# Patient Record
Sex: Female | Born: 1955 | Race: White | Hispanic: No | Smoking: Never smoker
Health system: Southern US, Community
[De-identification: ages and names within clinical notes are randomized; demographics above are authoritative.]

## PROBLEM LIST (undated history)

## (undated) DIAGNOSIS — E785 Hyperlipidemia, unspecified: Secondary | ICD-10-CM

## (undated) DIAGNOSIS — F329 Major depressive disorder, single episode, unspecified: Secondary | ICD-10-CM

## (undated) DIAGNOSIS — G4700 Insomnia, unspecified: Secondary | ICD-10-CM

## (undated) DIAGNOSIS — R5382 Chronic fatigue, unspecified: Secondary | ICD-10-CM

## (undated) DIAGNOSIS — F331 Major depressive disorder, recurrent, moderate: Secondary | ICD-10-CM

## (undated) DIAGNOSIS — G4719 Other hypersomnia: Secondary | ICD-10-CM

## (undated) DIAGNOSIS — R7989 Other specified abnormal findings of blood chemistry: Secondary | ICD-10-CM

## (undated) DIAGNOSIS — N183 Chronic kidney disease, stage 3 unspecified: Secondary | ICD-10-CM

## (undated) DIAGNOSIS — F32A Depression, unspecified: Secondary | ICD-10-CM

## (undated) DIAGNOSIS — I10 Essential (primary) hypertension: Secondary | ICD-10-CM

## (undated) HISTORY — DX: Other hypersomnia: G47.19

## (undated) HISTORY — PX: MELANOMA EXCISION: SHX5266

## (undated) HISTORY — DX: Other specified abnormal findings of blood chemistry: R79.89

## (undated) HISTORY — DX: Hyperlipidemia, unspecified: E78.5

## (undated) HISTORY — DX: Insomnia, unspecified: G47.00

## (undated) HISTORY — DX: Major depressive disorder, recurrent, moderate: F33.1

## (undated) HISTORY — PX: HYSTEROTOMY: SHX1776

## (undated) HISTORY — DX: Chronic kidney disease, stage 3 unspecified: N18.30

## (undated) HISTORY — DX: Chronic fatigue, unspecified: R53.82

## (undated) HISTORY — DX: Depression, unspecified: F32.A

## (undated) HISTORY — PX: OTHER SURGICAL HISTORY: SHX169

## (undated) HISTORY — DX: Essential (primary) hypertension: I10

---
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
## 1999-02-15 ENCOUNTER — Encounter: Payer: Self-pay | Admitting: Gynecology

## 1999-02-17 ENCOUNTER — Ambulatory Visit (HOSPITAL_COMMUNITY): Admission: RE | Admit: 1999-02-17 | Discharge: 1999-02-17 | Payer: Self-pay | Admitting: Gynecology

## 1999-04-27 ENCOUNTER — Encounter: Admission: RE | Admit: 1999-04-27 | Discharge: 1999-04-27 | Payer: Self-pay

## 1999-05-09 ENCOUNTER — Ambulatory Visit: Admission: RE | Admit: 1999-05-09 | Discharge: 1999-05-09 | Payer: Self-pay | Admitting: Gynecology

## 1999-06-05 ENCOUNTER — Ambulatory Visit (HOSPITAL_COMMUNITY): Admission: RE | Admit: 1999-06-05 | Discharge: 1999-06-05 | Payer: Self-pay | Admitting: Gastroenterology

## 2000-05-03 ENCOUNTER — Encounter: Admission: RE | Admit: 2000-05-03 | Discharge: 2000-05-03 | Payer: Self-pay | Admitting: Gynecology

## 2000-05-03 ENCOUNTER — Encounter: Payer: Self-pay | Admitting: Gynecology

## 2000-05-23 ENCOUNTER — Other Ambulatory Visit: Admission: RE | Admit: 2000-05-23 | Discharge: 2000-05-23 | Payer: Self-pay | Admitting: Gynecology

## 2001-04-23 HISTORY — PX: SKIN BIOPSY: SHX1

## 2001-05-05 ENCOUNTER — Encounter: Payer: Self-pay | Admitting: Gynecology

## 2001-05-05 ENCOUNTER — Encounter: Admission: RE | Admit: 2001-05-05 | Discharge: 2001-05-05 | Payer: Self-pay | Admitting: Gynecology

## 2001-05-26 ENCOUNTER — Other Ambulatory Visit: Admission: RE | Admit: 2001-05-26 | Discharge: 2001-05-26 | Payer: Self-pay | Admitting: Gynecology

## 2002-08-17 ENCOUNTER — Encounter: Admission: RE | Admit: 2002-08-17 | Discharge: 2002-08-17 | Payer: Self-pay | Admitting: Gynecology

## 2002-08-17 ENCOUNTER — Encounter: Payer: Self-pay | Admitting: Gynecology

## 2002-08-25 ENCOUNTER — Other Ambulatory Visit: Admission: RE | Admit: 2002-08-25 | Discharge: 2002-08-25 | Payer: Self-pay | Admitting: Gynecology

## 2003-10-14 ENCOUNTER — Other Ambulatory Visit: Admission: RE | Admit: 2003-10-14 | Discharge: 2003-10-14 | Payer: Self-pay | Admitting: Gynecology

## 2003-11-01 ENCOUNTER — Encounter: Admission: RE | Admit: 2003-11-01 | Discharge: 2003-11-01 | Payer: Self-pay | Admitting: Gynecology

## 2004-11-29 ENCOUNTER — Other Ambulatory Visit: Admission: RE | Admit: 2004-11-29 | Discharge: 2004-11-29 | Payer: Self-pay | Admitting: Gynecology

## 2004-12-05 ENCOUNTER — Encounter: Admission: RE | Admit: 2004-12-05 | Discharge: 2004-12-05 | Payer: Self-pay | Admitting: Gynecology

## 2005-12-06 ENCOUNTER — Encounter: Admission: RE | Admit: 2005-12-06 | Discharge: 2005-12-06 | Payer: Self-pay | Admitting: Gynecology

## 2006-01-01 ENCOUNTER — Other Ambulatory Visit: Admission: RE | Admit: 2006-01-01 | Discharge: 2006-01-01 | Payer: Self-pay | Admitting: Gynecology

## 2007-01-07 ENCOUNTER — Encounter: Admission: RE | Admit: 2007-01-07 | Discharge: 2007-01-07 | Payer: Self-pay | Admitting: Gynecology

## 2007-01-27 ENCOUNTER — Encounter: Admission: RE | Admit: 2007-01-27 | Discharge: 2007-01-27 | Payer: Self-pay | Admitting: Gynecology

## 2008-01-09 ENCOUNTER — Encounter: Admission: RE | Admit: 2008-01-09 | Discharge: 2008-01-09 | Payer: Self-pay | Admitting: Gynecology

## 2009-01-24 ENCOUNTER — Encounter: Admission: RE | Admit: 2009-01-24 | Discharge: 2009-01-24 | Payer: Self-pay | Admitting: Gynecology

## 2010-02-07 ENCOUNTER — Encounter: Admission: RE | Admit: 2010-02-07 | Discharge: 2010-02-07 | Payer: Self-pay | Admitting: Gynecology

## 2010-05-14 ENCOUNTER — Encounter: Payer: Self-pay | Admitting: Gynecology

## 2011-01-23 ENCOUNTER — Other Ambulatory Visit: Payer: Self-pay | Admitting: Gynecology

## 2011-01-23 DIAGNOSIS — Z1231 Encounter for screening mammogram for malignant neoplasm of breast: Secondary | ICD-10-CM

## 2011-02-15 ENCOUNTER — Ambulatory Visit
Admission: RE | Admit: 2011-02-15 | Discharge: 2011-02-15 | Disposition: A | Discharge status: Home / Self Care | Payer: 59 | Source: Ambulatory Visit | Attending: Gynecology | Admitting: Gynecology

## 2011-02-15 DIAGNOSIS — Z1231 Encounter for screening mammogram for malignant neoplasm of breast: Secondary | ICD-10-CM

## 2012-03-12 ENCOUNTER — Other Ambulatory Visit: Payer: Self-pay | Admitting: Gynecology

## 2012-03-12 DIAGNOSIS — Z1231 Encounter for screening mammogram for malignant neoplasm of breast: Secondary | ICD-10-CM

## 2012-03-31 ENCOUNTER — Other Ambulatory Visit: Payer: Self-pay | Admitting: Gynecology

## 2012-03-31 DIAGNOSIS — M8580 Other specified disorders of bone density and structure, unspecified site: Secondary | ICD-10-CM

## 2012-04-22 ENCOUNTER — Ambulatory Visit
Admission: RE | Admit: 2012-04-22 | Discharge: 2012-04-22 | Disposition: A | Discharge status: Home / Self Care | Payer: BC Managed Care – PPO | Source: Ambulatory Visit | Attending: Gynecology | Admitting: Gynecology

## 2012-04-22 DIAGNOSIS — Z1231 Encounter for screening mammogram for malignant neoplasm of breast: Secondary | ICD-10-CM

## 2012-04-22 DIAGNOSIS — M8580 Other specified disorders of bone density and structure, unspecified site: Secondary | ICD-10-CM

## 2018-12-10 DIAGNOSIS — L821 Other seborrheic keratosis: Secondary | ICD-10-CM | POA: Diagnosis not present

## 2018-12-10 DIAGNOSIS — D2262 Melanocytic nevi of left upper limb, including shoulder: Secondary | ICD-10-CM | POA: Diagnosis not present

## 2018-12-10 DIAGNOSIS — D485 Neoplasm of uncertain behavior of skin: Secondary | ICD-10-CM | POA: Diagnosis not present

## 2018-12-10 DIAGNOSIS — D1721 Benign lipomatous neoplasm of skin and subcutaneous tissue of right arm: Secondary | ICD-10-CM | POA: Diagnosis not present

## 2018-12-10 DIAGNOSIS — B078 Other viral warts: Secondary | ICD-10-CM | POA: Diagnosis not present

## 2018-12-10 DIAGNOSIS — D692 Other nonthrombocytopenic purpura: Secondary | ICD-10-CM | POA: Diagnosis not present

## 2018-12-10 DIAGNOSIS — D1801 Hemangioma of skin and subcutaneous tissue: Secondary | ICD-10-CM | POA: Diagnosis not present

## 2018-12-10 DIAGNOSIS — Z85828 Personal history of other malignant neoplasm of skin: Secondary | ICD-10-CM | POA: Diagnosis not present

## 2018-12-10 DIAGNOSIS — L820 Inflamed seborrheic keratosis: Secondary | ICD-10-CM | POA: Diagnosis not present

## 2018-12-10 DIAGNOSIS — Z85820 Personal history of malignant melanoma of skin: Secondary | ICD-10-CM | POA: Diagnosis not present

## 2018-12-10 DIAGNOSIS — L814 Other melanin hyperpigmentation: Secondary | ICD-10-CM | POA: Diagnosis not present

## 2018-12-10 DIAGNOSIS — L570 Actinic keratosis: Secondary | ICD-10-CM | POA: Diagnosis not present

## 2019-06-13 ENCOUNTER — Ambulatory Visit: Payer: PPO | Attending: Internal Medicine

## 2019-06-13 DIAGNOSIS — Z23 Encounter for immunization: Secondary | ICD-10-CM

## 2019-06-13 NOTE — Progress Notes (Signed)
   Covid-19 Vaccination Clinic  Name:  Anna Norman    MRN: [id number] DOB: 1955-11-08  06/13/2019  Anna Norman was observed post Covid-19 immunization for 15 minutes without incidence. She was provided with Vaccine Information Sheet and instruction to access the V-Safe system.   Anna Norman was instructed to call 911 with any severe reactions post vaccine: Marland Kitchen Difficulty breathing  . Swelling of your face and throat  . A fast heartbeat  . A bad rash all over your body  . Dizziness and weakness    Immunizations Administered    Name Date Dose VIS Date Route   Pfizer COVID-19 Vaccine 06/13/2019 12:34 PM 0.3 mL 04/03/2019 Intramuscular   Manufacturer: [place]   Lot: [id number]   [place]: [id number]

## 2019-07-06 ENCOUNTER — Ambulatory Visit: Payer: PPO | Attending: Internal Medicine

## 2019-07-06 DIAGNOSIS — Z23 Encounter for immunization: Secondary | ICD-10-CM

## 2019-07-06 NOTE — Progress Notes (Signed)
   Covid-19 Vaccination Clinic  Name:  Anna Norman    MRN: [id number] DOB: 1955-11-18  07/06/2019  [name] was observed post Covid-19 immunization for 15 minutes without incident. She was provided with Vaccine Information Sheet and instruction to access the V-Safe system.   [name] was instructed to call 911 with any severe reactions post vaccine: Marland Kitchen Difficulty breathing  . Swelling of face and throat  . A fast heartbeat  . A bad rash all over body  . Dizziness and weakness   Immunizations Administered    Name Date Dose VIS Date Route   Pfizer COVID-19 Vaccine 07/06/2019 11:42 AM 0.3 mL 04/03/2019 Intramuscular   Manufacturer: [place]   Lot: [id number]   [place]: [id number]

## 2019-08-17 DIAGNOSIS — I10 Essential (primary) hypertension: Secondary | ICD-10-CM | POA: Diagnosis not present

## 2019-08-17 DIAGNOSIS — Z0000 Encounter for general adult medical examination without abnormal findings: Secondary | ICD-10-CM | POA: Diagnosis not present

## 2019-08-17 DIAGNOSIS — E785 Hyperlipidemia, unspecified: Secondary | ICD-10-CM | POA: Diagnosis not present

## 2019-09-18 ENCOUNTER — Encounter: Payer: Self-pay | Admitting: Interventional Cardiology

## 2019-09-18 ENCOUNTER — Ambulatory Visit: Payer: PPO | Admitting: Interventional Cardiology

## 2019-09-18 ENCOUNTER — Other Ambulatory Visit: Payer: Self-pay

## 2019-09-18 VITALS — BP 154/94 | HR 89 | Ht 62.0 in | Wt 112.4 lb

## 2019-09-18 DIAGNOSIS — I10 Essential (primary) hypertension: Secondary | ICD-10-CM

## 2019-09-18 DIAGNOSIS — R9431 Abnormal electrocardiogram [ECG] [EKG]: Secondary | ICD-10-CM | POA: Diagnosis not present

## 2019-09-18 MED ORDER — LISINOPRIL 10 MG PO TABS: 10.0000 mg | ORAL_TABLET | Freq: Every day | ORAL | 3 refills | Status: DC

## 2019-09-18 NOTE — Patient Instructions (Addendum)
Medication Instructions:  Your physician has recommended you make the following change in your medication:  1-START Lisinopril 10 mg by mouth daily.  *If you need a refill on your cardiac medications before your next appointment, please call your pharmacy*   Lab Work: Your physician recommends that you return for lab work in: 1 week for [employer].  If you have labs (blood work) drawn today and your tests are completely normal, you will receive your results only by: Marland Kitchen MyChart Message (if you have MyChart) OR . A paper copy in the mail If you have any lab test that is abnormal or we need to change your treatment, we will call you to review the results.  Testing/Procedures: Your physician has requested that you have an echocardiogram. Echocardiography is a painless test that uses sound waves to create images of your heart. It provides your doctor with information about the size and shape of your heart and how well your heart's chambers and valves are working. This procedure takes approximately one hour. There are no restrictions for this procedure.  Follow-Up: Your physician recommends that you schedule a follow-up appointment in: 2 weeks with [place], you and your health needs are our priority.  As part of our continuing mission to provide you with exceptional heart care, we have created designated Provider Care Teams.  These Care Teams include your primary Cardiologist (physician) and Advanced Practice Providers (APPs -  Physician Assistants and Nurse Practitioners) who all work together to provide you with the care you need, when you need it.  We recommend signing up for the patient portal called "MyChart".  Sign up information is provided on this After Visit Summary.  MyChart is used to connect with patients for Virtual Visits (Telemedicine).  Patients are able to view lab/test results, encounter notes, upcoming appointments, etc.  Non-urgent messages can be sent  to your provider as well.   To learn more about what you can do with MyChart, go to [email or web address].    Your next appointment:   As needed  The format for your next appointment:   In Person  Provider:   You may see Larae Grooms, MD or one of the following Advanced Practice Providers on your designated Care Team:    Melina Copa, PA-C  Ermalinda Barrios, PA-C    Other Instructions   High-Fiber Diet Fiber, also called dietary fiber, is a type of carbohydrate that is found in fruits, vegetables, whole grains, and beans. A high-fiber diet can have many health benefits. Your health care provider may recommend a high-fiber diet to help:  Prevent constipation. Fiber can make your bowel movements more regular.  Lower your cholesterol.  Relieve the following conditions: ? Swelling of veins in the anus (hemorrhoids). ? Swelling and irritation (inflammation) of specific areas of the digestive tract (uncomplicated diverticulosis). ? A problem of the large intestine (colon) that sometimes causes pain and diarrhea (irritable bowel syndrome, IBS).  Prevent overeating as part of a weight-loss plan.  Prevent heart disease, type 2 diabetes, and certain cancers. What is my plan? The recommended daily fiber intake in grams (g) includes:  38 g for men age 52 or younger.  30 g for men over age 78.  2 g for women age 30 or younger.  21 g for women over age 78. You can get the recommended daily intake of dietary fiber by:  Eating a variety of fruits, vegetables, grains, and beans.  Taking a fiber supplement, if  it is not possible to get enough fiber through your diet. What do I need to know about a high-fiber diet?  It is better to get fiber through food sources rather than from fiber supplements. There is not a lot of research about how effective supplements are.  Always check the fiber content on the nutrition facts label of any prepackaged food. Look for foods that contain 5  g of fiber or more per serving.  Talk with a diet and nutrition specialist (dietitian) if you have questions about specific foods that are recommended or not recommended for your medical condition, especially if those foods are not listed below.  Gradually increase how much fiber you consume. If you increase your intake of dietary fiber too quickly, you may have bloating, cramping, or gas.  Drink plenty of water. Water helps you to digest fiber. What are tips for following this plan?  Eat a wide variety of high-fiber foods.  Make sure that half of the grains that you eat each day are whole grains.  Eat breads and cereals that are made with whole-grain flour instead of refined flour or white flour.  Eat brown rice, bulgur wheat, or millet instead of white rice.  Start the day with a breakfast that is high in fiber, such as a cereal that contains 5 g of fiber or more per serving.  Use beans in place of meat in soups, salads, and pasta dishes.  Eat high-fiber snacks, such as berries, raw vegetables, nuts, and popcorn.  Choose whole fruits and vegetables instead of processed forms like juice or sauce. What foods can I eat?  Fruits Berries. Pears. Apples. Oranges. Avocado. Prunes and raisins. Dried figs. Vegetables Sweet potatoes. Spinach. Kale. Artichokes. Cabbage. Broccoli. Cauliflower. Green peas. Carrots. Squash. Grains Whole-grain breads. Multigrain cereal. Oats and oatmeal. Brown rice. Barley. Bulgur wheat. [place]. Quinoa. Bran muffins. Popcorn. Rye wafer crackers. Meats and other proteins Navy, kidney, and pinto beans. Soybeans. Split peas. Lentils. Nuts and seeds. Dairy Fiber-fortified yogurt. Beverages Fiber-fortified soy milk. Fiber-fortified orange juice. Other foods Fiber bars. The items listed above may not be a complete list of recommended foods and beverages. Contact a dietitian for more options. What foods are not recommended? Fruits Fruit juice. Cooked, strained  fruit. Vegetables Fried potatoes. Canned vegetables. Well-cooked vegetables. Grains White bread. Pasta made with refined flour. White rice. Meats and other proteins Fatty cuts of meat. Fried chicken or fried fish. Dairy Milk. Yogurt. Cream cheese. Sour cream. Fats and oils Butters. Beverages Soft drinks. Other foods Cakes and pastries. The items listed above may not be a complete list of foods and beverages to avoid. Contact a dietitian for more information. Summary  Fiber is a type of carbohydrate. It is found in fruits, vegetables, whole grains, and beans.  There are many health benefits of eating a high-fiber diet, such as preventing constipation, lowering blood cholesterol, helping with weight loss, and reducing your risk of heart disease, diabetes, and certain cancers.  Gradually increase your intake of fiber. Increasing too fast can result in cramping, bloating, and gas. Drink plenty of water while you increase your fiber.  The best sources of fiber include whole fruits and vegetables, whole grains, nuts, seeds, and beans. This information is not intended to replace advice given to you by your health care provider. Make sure you discuss any questions you have with your health care provider. Document Revised: 02/11/2017 Document Reviewed: 02/11/2017 Elsevier Patient Education  2020 [employer].

## 2019-09-18 NOTE — Progress Notes (Signed)
Cardiology Office Note   Date:  09/18/2019   ID:  Anna, Norman 1955/08/02, MRN [id number]  PCP:  Anna Small, MD    No chief complaint on file.  Abnormal ECG  Wt Readings from Last 3 Encounters:  09/18/19 112 lb 6.4 oz (51 kg)       History of Present Illness: Anna Norman is a 64 y.o. female who is being seen today for the evaluation of abnormal ECG at the request of Anna Small, MD.  No prior cardiac history.  She thinks she had an abnormal CRP in the past, 15-20 years ago.  No further tests were done.  No prior ECG or cardiac imaging done in the past.    No family h/o CAD. 3 brothers are healthy form a cardiac standpoint.  One brother with multiple myeloma.  Other brothers with high cholesterol.  No stents or CABG.   She walks for an hour 2 times/week.  No sx with this activity.  She had an ECG at PMD when her BP was elevated (170s).  She has been on a diuretic for BP for over 20 years.   She had been having high BP readings that were high when she donated plasma in late 2020.   She limits salt in her diet.    Denies : exertional Chest pain. Dizziness. Leg edema. Nitroglycerin use. Orthopnea. Palpitations. Paroxysmal nocturnal dyspnea. Shortness of breath. Syncope.     Past Medical History:  Diagnosis Date  . Chronic fatigue   . Chronic kidney disease (CKD), active medical management without dialysis, stage 3 (moderate)   . Depression   . Elevated LFTs   . Excessive daytime sleepiness   . Hyperlipidemia   . Hypertension   . Insomnia   . Moderate recurrent major depression ([place])        Current Outpatient Medications  Medication Sig Dispense Refill  . buPROPion (WELLBUTRIN XL) 300 MG 24 hr tablet Take 300 mg by mouth daily.    . Estradiol (VAGIFEM) 10 MCG TABS vaginal tablet Place vaginally.    . Ferrous Sulfate (IRON PO) Take by mouth.    . Multiple Vitamin (MULTIVITAMIN) tablet Take 1 tablet by mouth daily.    . traZODone  (DESYREL) 50 MG tablet Take 50 mg by mouth at bedtime.    . triamterene-hydrochlorothiazide (MAXZIDE) 75-50 MG tablet Take 1 tablet by mouth daily.    Marland Kitchen lisinopril (ZESTRIL) 10 MG tablet Take 1 tablet (10 mg total) by mouth daily. 90 tablet 3   No current facility-administered medications for this visit.    Allergies:   Patient has no allergy information on record.    Social History:  The patient  reports that she has never smoked. She has never used smokeless tobacco. She reports current alcohol use. She reports that she does not use drugs.   Family History:  The patient's family history includes Cancer in her brother and brother; Cancer - Prostate in her father; Colon polyps in her brother; Glaucoma in her mother; Healthy in her brother; Hypertension in her mother; Other in her brother.    ROS:  Please see the history of present illness.   Otherwise, review of systems are positive for twinges in her chest-lasting a few seconds.   All other systems are reviewed and negative.    PHYSICAL EXAM: VS:  BP (!) 154/94   Pulse 89   Ht 5' 2" (1.575 m)   Wt 112 lb 6.4 oz (51 kg)  SpO2 98%   BMI 20.56 kg/m  , BMI Body mass index is 20.56 kg/m. GEN: Well nourished, well developed, in no acute distress  HEENT: normal  Neck: no JVD, carotid bruits, or masses Cardiac: RRR; no murmurs, rubs, or gallops,no edema  Respiratory:  clear to auscultation bilaterally, normal work of breathing GI: soft, nontender, nondistended, + BS MS: no deformity or atrophy  Skin: warm and dry, no rash Neuro:  Strength and sensation are intact Psych: euthymic mood, full affect   EKG:   The ekg ordered today demonstrates NSR, inferior Q waves; mild ST depressions   Recent Labs: No results found for requested labs within last 8760 hours.   Lipid Panel No results found for: CHOL, TRIG, HDL, CHOLHDL, VLDL, LDLCALC, LDLDIRECT   Other studies Reviewed: Additional studies/ records that were reviewed today with  results demonstrating: ECG and labs from PMD reviewed .   ASSESSMENT AND PLAN:  1. HTN: Start lisinopril 10 mg daily. BMet in 1 week.  F/u with HTN clinic.  May need to stop triamterene if potassium increases.  Low salt diet.  2. Plan for echo given abnormal ECG. If abnormal EF, may need cath.  If normal EF, likely no further w/u needed.  No old ECG available.  3. Low salt diet.  Whole food, plant based diet recommended.     Current medicines are reviewed at length with the patient today.  The patient concerns regarding her medicines were addressed.  The following changes have been made:  No change  Labs/ tests ordered today include:   Orders Placed This Encounter  Procedures  . Basic metabolic panel  . ECHOCARDIOGRAM COMPLETE    Recommend 150 minutes/week of aerobic exercise Low fat, low carb, high fiber diet recommended  Disposition:   FU with PharmD HTN clinic   Signed, Larae Grooms, MD  09/18/2019 1:14 PM    [facility], [place], [place]  [address] Phone: [phone]; Fax: [phone]

## 2019-09-25 ENCOUNTER — Other Ambulatory Visit: Payer: Self-pay

## 2019-09-25 ENCOUNTER — Other Ambulatory Visit: Payer: PPO | Admitting: *Deleted

## 2019-09-25 DIAGNOSIS — I10 Essential (primary) hypertension: Secondary | ICD-10-CM | POA: Diagnosis not present

## 2019-09-25 DIAGNOSIS — R9431 Abnormal electrocardiogram [ECG] [EKG]: Secondary | ICD-10-CM | POA: Diagnosis not present

## 2019-09-25 LAB — BASIC METABOLIC PANEL
BUN/Creatinine Ratio: 17 (ref 12–28)
BUN: 22 mg/dL (ref 8–27)
CO2: 24 mmol/L (ref 20–29)
Calcium: 9.6 mg/dL (ref 8.7–10.3)
Chloride: 97 mmol/L (ref 96–106)
Creatinine, Ser: 1.29 mg/dL — ABNORMAL HIGH (ref 0.57–1.00)
GFR calc Af Amer: 50 mL/min/{1.73_m2} — ABNORMAL LOW (ref >59)
GFR calc non Af Amer: 44 mL/min/{1.73_m2} — ABNORMAL LOW (ref >59)
Glucose: 97 mg/dL (ref 65–99)
Potassium: 4.4 mmol/L (ref 3.5–5.2)
Sodium: 136 mmol/L (ref 134–144)

## 2019-09-30 ENCOUNTER — Telehealth: Payer: Self-pay | Admitting: Interventional Cardiology

## 2019-09-30 DIAGNOSIS — R7989 Other specified abnormal findings of blood chemistry: Secondary | ICD-10-CM

## 2019-09-30 DIAGNOSIS — R899 Unspecified abnormal finding in specimens from other organs, systems and tissues: Secondary | ICD-10-CM

## 2019-09-30 DIAGNOSIS — Z79899 Other long term (current) drug therapy: Secondary | ICD-10-CM

## 2019-09-30 NOTE — Telephone Encounter (Signed)
Patient returning call for lab results. 

## 2019-09-30 NOTE — Telephone Encounter (Signed)
Left message to call back  

## 2019-10-01 NOTE — Telephone Encounter (Signed)
Patient returning call.

## 2019-10-01 NOTE — Telephone Encounter (Signed)
Jettie Booze, MD  09/29/2019 11:24 PM EDT     Would have her drink 64 oz water daily and recheck BMet at visit with PharmD.    Spoke with the pt and informed her that per Dr. Irish Lack, based on her lab results, she needs to increase her water intake to drinking 64 oz of H2O a day, and then we will recheck her BMET again, same day as she has her echo and PharmD appt, on 10/07/19. Pt education provided that 64 oz of water is equivalent to drinking #4 16 oz bottles of water daily.  Scheduled the pt to come in for lab to recheck a BMET on 10/07/19.  Informed her that she can arrive at 2 pm, get her lab done, then proceed with her scheduled echo and pharmD appt thereafter. Pt verbalized understanding and agrees with this plan. Pt appreciative for all the assistance provided.

## 2019-10-07 ENCOUNTER — Ambulatory Visit (HOSPITAL_COMMUNITY): Payer: PPO | Attending: Cardiovascular Disease

## 2019-10-07 ENCOUNTER — Other Ambulatory Visit: Payer: Self-pay

## 2019-10-07 ENCOUNTER — Ambulatory Visit (INDEPENDENT_AMBULATORY_CARE_PROVIDER_SITE_OTHER): Payer: PPO | Admitting: Pharmacist

## 2019-10-07 ENCOUNTER — Other Ambulatory Visit: Payer: PPO | Admitting: *Deleted

## 2019-10-07 DIAGNOSIS — R9431 Abnormal electrocardiogram [ECG] [EKG]: Secondary | ICD-10-CM | POA: Diagnosis not present

## 2019-10-07 DIAGNOSIS — E785 Hyperlipidemia, unspecified: Secondary | ICD-10-CM | POA: Insufficient documentation

## 2019-10-07 DIAGNOSIS — I10 Essential (primary) hypertension: Secondary | ICD-10-CM

## 2019-10-07 DIAGNOSIS — R7989 Other specified abnormal findings of blood chemistry: Secondary | ICD-10-CM | POA: Diagnosis not present

## 2019-10-07 DIAGNOSIS — R899 Unspecified abnormal finding in specimens from other organs, systems and tissues: Secondary | ICD-10-CM | POA: Diagnosis not present

## 2019-10-07 DIAGNOSIS — Z79899 Other long term (current) drug therapy: Secondary | ICD-10-CM

## 2019-10-07 DIAGNOSIS — Z8659 Personal history of other mental and behavioral disorders: Secondary | ICD-10-CM | POA: Insufficient documentation

## 2019-10-07 LAB — ECHOCARDIOGRAM COMPLETE

## 2019-10-07 NOTE — Progress Notes (Signed)
Patient ID: CLARE FENNIMORE                 DOB: 1955/11/23                      MRN: [id number]     HPI: Linsi Humann is a 64 y.o. female referred by Dr. Irish Lack to HTN clinic. PMH is significant for HTN, HLD, and depression. She was last seen by Dr Irish Lack on 09/18/19 and BP was elevated at 154/94. Pt was started on lisinopril 10mg  daily. Follow up BMET showed increase in SCr of 27% - acceptable rise < 30% threshold, pt advised to drink more water. Meds continued and plan to recheck BMET today along with BP follow up and echo.  Pt presents today in good spirits. State she has been on triamterene-HCTZ for the past 20-25 years by her PCP. Has not tried any other BP lowering meds. Does not own a BP cuff. Donates plasma and states BP was elevated there the other week with systolic ~678. Felt a little dizzy for the first few days after starting lisinopril but this has since resolved. Uses NSAIDs occasionally, maybe once a month. Has been staying more hydrated over the past few weeks. Does have some family stress at home.  Current HTN meds: lisinopril 10mg  daily, triamterene-HCTZ 75-50mg  daily  BP goal: <130/43mmHg  Family History: The patient's family history includes Cancer in her brother and brother; Cancer - Prostate in her father; Colon polyps in her brother; Glaucoma in her mother; Healthy in her brother; Hypertension in her mother; Other in her brother.   Social History: The patient  reports that she has never smoked. She has never used smokeless tobacco. She reports current alcohol use. She reports that she does not use drugs.   Diet: Occasionally drinks tea, soda, or coffee. Typically has a total of 3 servings a day. Doesn't add salt to food.   Exercise: Walks for an hour 2x.week.  Home BP readings: does not have a BP cuff  Labs: 09/25/19: SCr 1.29, Na 136, K 4.4 (added lisinopril 10mg ) 09/10/19: SCr 0.94, Na 138, K 4.4 (on triamterene-HCTZ)  Wt Readings from Last 3 Encounters:    09/18/19 112 lb 6.4 oz (51 kg)   BP Readings from Last 3 Encounters:  09/18/19 (!) 154/94   Pulse Readings from Last 3 Encounters:  09/18/19 89    Renal function: CrCl cannot be calculated (Unknown ideal weight.).  Past Medical History:  Diagnosis Date  . Chronic fatigue   . Chronic kidney disease (CKD), active medical management without dialysis, stage 3 (moderate)   . Depression   . Elevated LFTs   . Excessive daytime sleepiness   . Hyperlipidemia   . Hypertension   . Insomnia   . Moderate recurrent major depression ([place])     Current Outpatient Medications on File Prior to Visit  Medication Sig Dispense Refill  . buPROPion (WELLBUTRIN XL) 300 MG 24 hr tablet Take 300 mg by mouth daily.    . Estradiol (VAGIFEM) 10 MCG TABS vaginal tablet Place vaginally.    . Ferrous Sulfate (IRON PO) Take by mouth.    Marland Kitchen lisinopril (ZESTRIL) 10 MG tablet Take 1 tablet (10 mg total) by mouth daily. 90 tablet 3  . Multiple Vitamin (MULTIVITAMIN) tablet Take 1 tablet by mouth daily.    . traZODone (DESYREL) 50 MG tablet Take 50 mg by mouth at bedtime.    . triamterene-hydrochlorothiazide (MAXZIDE) 75-50 MG tablet Take 1  tablet by mouth daily.     No current facility-administered medications on file prior to visit.    Not on File   Assessment/Plan:  1. Hypertension - BP remains elevated above goal <130/37mmHg. Rechecking BMET today since SCr bumped a bit after lisinopril start. If labs are stable, can increase lisinopril to 20mg  daily. Otherwise, will plan to add amlodipine 5mg  daily. Will call pt tomorrow with results and medication plan and schedule f/u at that time. Encouraged pt to limit daily sodium intake to 000mg  daily, stay active, decrease caffeine to 2 cups per day, and avoid NSAIDs.  Rickey Sadowski E. Nettie Cromwell, PharmD, BCACP, [facility] [address], [place], [place] [address] Phone: [phone]; Fax: [phone] 10/07/2019 2:34 PM

## 2019-10-07 NOTE — Patient Instructions (Addendum)
It was nice to meet you today!  Your blood pressure goal is < 130/71mmHg  Continue taking your current medications  Limit your daily sodium intake to < 2,000mg  daily  Decrease caffeine intake to 2 servings per day  I'll give you a call tomorrow with your lab results. We may either increase the dose of your lisinopril or start you on a medicine called amlodipine

## 2019-10-08 ENCOUNTER — Telehealth: Payer: Self-pay | Admitting: Pharmacist

## 2019-10-08 LAB — BASIC METABOLIC PANEL
BUN/Creatinine Ratio: 19 (ref 12–28)
BUN: 16 mg/dL (ref 8–27)
CO2: 26 mmol/L (ref 20–29)
Calcium: 9.6 mg/dL (ref 8.7–10.3)
Chloride: 91 mmol/L — ABNORMAL LOW (ref 96–106)
Creatinine, Ser: 0.86 mg/dL (ref 0.57–1.00)
GFR calc Af Amer: 82 mL/min/{1.73_m2} (ref >59)
GFR calc non Af Amer: 71 mL/min/{1.73_m2} (ref >59)
Glucose: 93 mg/dL (ref 65–99)
Potassium: 4.1 mmol/L (ref 3.5–5.2)
Sodium: 130 mmol/L — ABNORMAL LOW (ref 134–144)

## 2019-10-08 NOTE — Telephone Encounter (Signed)
Left message for pt to discuss lab results.   BMET 6/4 and 6/17 both reflect pt on lisinopril 10mg  daily and Maxzide 75-50mg  daily. Pt had been encouraged to increase water intake as SCr bumped 27% after lisinopril start. SCr has since normalized, however Na and Cl now low, likely due to increased water intake rather than Maxzide effects as pt has been taking Maxzide for 20+ years.  Will plan to increase lisinopril to 20mg  daily, continue Maxzide, resume normal fluid intake, and schedule follow up in BP clinic in ~3 weeks.

## 2019-10-09 NOTE — Telephone Encounter (Signed)
Spoke with patient over phone regarding lab work.  Patient had increased water intake over previous two weeks and Scr has now normalized.  However, Na and Cl were low so recommended patient resume normal water intake.  Advised patient to increase lisinopril to 20 mg daily and continue maxzide.  Patient voiced understanding.

## 2019-10-29 DIAGNOSIS — N952 Postmenopausal atrophic vaginitis: Secondary | ICD-10-CM | POA: Diagnosis not present

## 2019-10-29 DIAGNOSIS — Z1231 Encounter for screening mammogram for malignant neoplasm of breast: Secondary | ICD-10-CM | POA: Diagnosis not present

## 2019-10-29 DIAGNOSIS — Z01419 Encounter for gynecological examination (general) (routine) without abnormal findings: Secondary | ICD-10-CM | POA: Diagnosis not present

## 2019-10-29 DIAGNOSIS — Z6820 Body mass index (BMI) 20.0-20.9, adult: Secondary | ICD-10-CM | POA: Diagnosis not present

## 2019-10-29 DIAGNOSIS — Z780 Asymptomatic menopausal state: Secondary | ICD-10-CM | POA: Diagnosis not present

## 2019-11-04 ENCOUNTER — Ambulatory Visit (INDEPENDENT_AMBULATORY_CARE_PROVIDER_SITE_OTHER): Payer: PPO | Admitting: Pharmacist

## 2019-11-04 ENCOUNTER — Other Ambulatory Visit: Payer: Self-pay

## 2019-11-04 ENCOUNTER — Encounter: Payer: Self-pay | Admitting: Pharmacist

## 2019-11-04 VITALS — BP 95/62 | HR 85

## 2019-11-04 DIAGNOSIS — I10 Essential (primary) hypertension: Secondary | ICD-10-CM

## 2019-11-04 NOTE — Progress Notes (Signed)
Patient ID: Anna Norman                 DOB: [birth year]                      MRN: [id number]     HPI: Anna Norman is a 64 y.o. female referred by Dr. Irish Lack to HTN clinic. PMH is significant for CKD, depression, HLD, and HTN.  Seen in HTN clinic on 6/16 after BMP showed a slight increase in serum creatinine.  Upon repeat labs, Scr normalized and lisnoprl was increased to 20mg  daily.  BP has remained elevated however.  Patient presents today in good spirits.  Is watching her diet, eating oatmeal and fruits.  Has not been checking blood pressure at home, does not have machine or cuff.  Takes her lisinopril and Maxzide in the morning.  Has been experiencing occasional dizziness, multiple times per week.  Sometimes thinks it may be related to the heat.    Current HTN meds:  Lisinopril 20 mg, triamterene/hctz 75/50 Previously tried: lisinopril 10 mg BP goal: <130/80   Social History: The patientreports that she has never smoked. She has never used smokeless tobacco. She reports current alcohol use. She reports that she does not use drugs.  Diet: Reports not as active as she could be  Exercise: Reports not as active as she should be   Wt Readings from Last 3 Encounters:  09/18/19 112 lb 6.4 oz (51 kg)   BP Readings from Last 3 Encounters:  10/07/19 (!) 150/82  09/18/19 (!) 154/94   Pulse Readings from Last 3 Encounters:  10/07/19 79  09/18/19 89    Renal function: CrCl cannot be calculated (Patient's most recent lab result is older than the maximum 21 days allowed.).  Past Medical History:  Diagnosis Date  . Chronic fatigue   . Chronic kidney disease (CKD), active medical management without dialysis, stage 3 (moderate)   . Depression   . Elevated LFTs   . Excessive daytime sleepiness   . Hyperlipidemia   . Hypertension   . Insomnia   . Moderate recurrent major depression ([place])     Current Outpatient Medications on File Prior to Visit  Medication  Sig Dispense Refill  . buPROPion (WELLBUTRIN XL) 300 MG 24 hr tablet Take 300 mg by mouth daily.    . CHELATED IRON PO Take 29 mg by mouth daily.    . Estradiol (VAGIFEM) 10 MCG TABS vaginal tablet Place vaginally.    Marland Kitchen lisinopril (ZESTRIL) 10 MG tablet Take 1 tablet (10 mg total) by mouth daily. 90 tablet 3  . Multiple Vitamin (MULTIVITAMIN) tablet Take 1 tablet by mouth daily.    . traZODone (DESYREL) 50 MG tablet Take 50 mg by mouth at bedtime.    . triamterene-hydrochlorothiazide (MAXZIDE) 75-50 MG tablet Take 1 tablet by mouth daily.     No current facility-administered medications on file prior to visit.    Not on File   Assessment/Plan:  1. Hypertension - Patient blood pressure in room 105/68 using manual cuff.  Verified using electronic BP cuff and received 95/62.  Both are below goal of <130/80.  Patient BP may be consistently running low which could be contributing to her dizziness.  Recommended patient decrease her lisinopril to 10 mg daily and purchase a BP cuff to start checking at home.  Gave patient options from validateBP.org and patient will purchase today.  Recommended patient check BP daily, and check again when  she feels dizzy.  If BP begins to trend high recommended she call clinic and report numbers.  Patient voiced understanding.  Karren Cobble, PharmD, BCACP, [facility] [address], [place], [place] [address] Phone: [phone]; Fax: [phone] 11/04/2019 3:10 PM

## 2019-11-04 NOTE — Patient Instructions (Addendum)
It was great meeting you today!  Your blood pressure goal is <130/80  Today your blood pressure is 105/68 so we will reduce your lisinopril to 10 mg daily  Continue your Maxzide each morning  Purchase an Omron 3 blood pressure cuff  Call with any questions  Karren Cobble, PharmD, BCACP, [facility] [address], [place], [place] [address] Phone: [phone]; Fax: [phone] 11/04/2019 2:19 PM

## 2020-02-24 DIAGNOSIS — D485 Neoplasm of uncertain behavior of skin: Secondary | ICD-10-CM | POA: Diagnosis not present

## 2020-02-24 DIAGNOSIS — L814 Other melanin hyperpigmentation: Secondary | ICD-10-CM | POA: Diagnosis not present

## 2020-02-24 DIAGNOSIS — L821 Other seborrheic keratosis: Secondary | ICD-10-CM | POA: Diagnosis not present

## 2020-02-24 DIAGNOSIS — L570 Actinic keratosis: Secondary | ICD-10-CM | POA: Diagnosis not present

## 2020-02-24 DIAGNOSIS — D1801 Hemangioma of skin and subcutaneous tissue: Secondary | ICD-10-CM | POA: Diagnosis not present

## 2020-02-24 DIAGNOSIS — B079 Viral wart, unspecified: Secondary | ICD-10-CM | POA: Diagnosis not present

## 2020-02-24 DIAGNOSIS — D045 Carcinoma in situ of skin of trunk: Secondary | ICD-10-CM | POA: Diagnosis not present

## 2020-02-24 DIAGNOSIS — Z85820 Personal history of malignant melanoma of skin: Secondary | ICD-10-CM | POA: Diagnosis not present

## 2020-02-24 DIAGNOSIS — Z85828 Personal history of other malignant neoplasm of skin: Secondary | ICD-10-CM | POA: Diagnosis not present

## 2020-02-24 DIAGNOSIS — B078 Other viral warts: Secondary | ICD-10-CM | POA: Diagnosis not present

## 2020-06-24 ENCOUNTER — Other Ambulatory Visit: Payer: Self-pay | Admitting: Interventional Cardiology

## 2020-09-22 ENCOUNTER — Other Ambulatory Visit: Payer: Self-pay | Admitting: Interventional Cardiology

## 2020-11-09 NOTE — Progress Notes (Signed)
Cardiology Office Note   Date:  11/11/2020   ID:  Anna Norman, Anna Norman August 08, 1955, MRN [id number]  PCP:  Maurice Small, MD    No chief complaint on file.  HTN  Wt Readings from Last 3 Encounters:  11/11/20 105 lb 6.4 oz (47.8 kg)  09/18/19 112 lb 6.4 oz (51 kg)       History of Present Illness: Anna Norman is a 65 y.o. female  who I saw for the evaluation of abnormal ECG at the request of Maurice Small, MD.   No prior cardiac history.  She thinks she had an abnormal CRP in the past, 15-20 years ago.  No further tests were done.  No prior ECG or cardiac imaging done in the past.     No family h/o CAD. 3 brothers are healthy form a cardiac standpoint.  One brother with multiple myeloma.  Other brothers with high cholesterol.  No stents or CABG.   She walks for an hour 2 times/week.  No sx with this activity.   She had an ECG at [facility] when her BP was elevated (170s).  She has been on a diuretic for BP for over 20 years.   She had been having high BP readings that were high when she donated plasma in late 2020.   She limits salt in her diet.    2021 echo showed: "Left ventricular ejection fraction, by estimation, is 60 to 65%. The  left ventricle has normal function. The left ventricle has no regional  wall motion abnormalities. Left ventricular diastolic parameters were  normal.   2. Right ventricular systolic function is normal. The right ventricular  size is normal. There is normal pulmonary artery systolic pressure. The  estimated right ventricular systolic pressure is 92.4 mmHg.   3. The mitral valve is grossly normal. No evidence of mitral valve  regurgitation. No evidence of mitral stenosis.   4. The aortic valve is tricuspid. Aortic valve regurgitation is not  visualized. No aortic stenosis is present.   5. The inferior vena cava is normal in size with greater than 50%  respiratory variability, suggesting right atrial pressure of 3 mmHg. "  Walking  some.  Now working at [employer].    Denies : Chest pain. Dizziness. Leg edema. Nitroglycerin use. Orthopnea. Palpitations. Paroxysmal nocturnal dyspnea. Shortness of breath. Syncope.    Mother turned 67 in 4/22.  Father with prostate cancer.  No early CAD.   Past Medical History:  Diagnosis Date   Chronic fatigue    Chronic kidney disease (CKD), active medical management without dialysis, stage 3 (moderate) (HCC)    Depression    Elevated LFTs    Excessive daytime sleepiness    Hyperlipidemia    Hypertension    Insomnia    Moderate recurrent major depression ([place])     Past Surgical History:  Procedure Laterality Date   endometrosis     HYSTEROTOMY     MELANOMA EXCISION  2009x2, 2008   SKIN BIOPSY  2003   vaginal biospy       Current Outpatient Medications  Medication Sig Dispense Refill   buPROPion (WELLBUTRIN XL) 300 MG 24 hr tablet Take 300 mg by mouth daily.     CHELATED IRON PO Take 29 mg by mouth daily.     Estradiol 10 MCG TABS vaginal tablet Place vaginally.     fluorouracil (EFUDEX) 5 % cream Apply topically 2 (two) times daily.     imiquimod (ALDARA)  5 % cream imiquimod 5 % topical cream packet     lisinopril (ZESTRIL) 10 MG tablet TAKE 1 TABLET (10 MG TOTAL) BY MOUTH DAILY. PLEASE MAKE YEARLY APPT WITH DR. Lera Gaines FOR MAY 2022 FOR FUTURE REFILLS. THANK YOU 1ST ATTEMPT 90 tablet 0   Multiple Vitamin (MULTIVITAMIN) tablet Take 1 tablet by mouth daily.     mupirocin ointment (BACTROBAN) 2 % mupirocin 2 % topical ointment     traZODone (DESYREL) 50 MG tablet Take 50 mg by mouth at bedtime.     triamterene-hydrochlorothiazide (MAXZIDE) 75-50 MG tablet Take 1 tablet by mouth daily.     No current facility-administered medications for this visit.    Allergies:   Patient has no allergy information on record.    Social History:  The patient  reports that she has never smoked. She has never used smokeless tobacco. She reports current alcohol use. She reports  that she does not use drugs.   Family History:  The patient's family history includes Cancer in her brother and brother; Cancer - Prostate in her father; Colon polyps in her brother; Glaucoma in her mother; Healthy in her brother; Hypertension in her mother; Other in her brother.    ROS:  Please see the history of present illness.   Otherwise, review of systems are positive for diagnosed with chronic kidney disease.   All other systems are reviewed and negative.    PHYSICAL EXAM: VS:  BP (!) 142/68   Pulse 86   Ht '5\' 2"'  (1.575 m)   Wt 105 lb 6.4 oz (47.8 kg)   SpO2 98%   BMI 19.28 kg/m  , BMI Body mass index is 19.28 kg/m. GEN: Well nourished, well developed, in no acute distress HEENT: normal Neck: no JVD, carotid bruits, or masses Cardiac: RRR; no murmurs, rubs, or gallops,no edema  Respiratory:  clear to auscultation bilaterally, normal work of breathing GI: soft, nontender, nondistended, + BS MS: no deformity or atrophy; 2+ PT pulses Skin: warm and dry, no rash Neuro:  Strength and sensation are intact Psych: euthymic mood, full affect   EKG:   The ekg ordered today demonstrates NSR, nonspecific ST changes   Recent Labs: No results found for requested labs within last 8760 hours.   Lipid Panel No results found for: CHOL, TRIG, HDL, CHOLHDL, VLDL, LDLCALC, LDLDIRECT   Other studies Reviewed: Additional studies/ records that were reviewed today with results demonstrating: labs reviewed.   ASSESSMENT AND PLAN:  HTN: Not checking at home.  Low salt diet.  The current medical regimen is effective;  continue present plan and medications.  Plan Calcium scoring CT scan. Whole food, plant based diet.  CRI: Avoid nephrotoxins.   LDL 137, HDL 80.  CT scan will help decide if statin therapy is needed. Whole food, plant based diet.   Normal echo with abnormal ECG in 2021.    Current medicines are reviewed at length with the patient today.  The patient concerns regarding her  medicines were addressed.  The following changes have been made:  No change  Labs/ tests ordered today include:  No orders of the defined types were placed in this encounter.   Recommend 150 minutes/week of aerobic exercise Low fat, low carb, high fiber diet recommended  Disposition:   FU in 1 year   Signed, Larae Grooms, MD  11/11/2020 10:37 AM    [facility] [place], [place], [place]  [address] Phone: [phone]; Fax: [phone]

## 2020-11-11 ENCOUNTER — Encounter: Payer: Self-pay | Admitting: Interventional Cardiology

## 2020-11-11 ENCOUNTER — Other Ambulatory Visit: Payer: Self-pay

## 2020-11-11 ENCOUNTER — Ambulatory Visit: Payer: PPO | Admitting: Interventional Cardiology

## 2020-11-11 VITALS — BP 142/68 | HR 86 | Ht 62.0 in | Wt 105.4 lb

## 2020-11-11 DIAGNOSIS — N183 Chronic kidney disease, stage 3 unspecified: Secondary | ICD-10-CM | POA: Diagnosis not present

## 2020-11-11 DIAGNOSIS — R9431 Abnormal electrocardiogram [ECG] [EKG]: Secondary | ICD-10-CM

## 2020-11-11 DIAGNOSIS — I10 Essential (primary) hypertension: Secondary | ICD-10-CM | POA: Diagnosis not present

## 2020-11-11 NOTE — Patient Instructions (Signed)
Medication Instructions:  Your physician recommends that you continue on your current medications as directed. Please refer to the Current Medication list given to you today.  *If you need a refill on your cardiac medications before your next appointment, please call your pharmacy*   Lab Work: Your physician recommends that you return for lab work --CMET, lipid profile, CBC  If you have labs (blood work) drawn today and your tests are completely normal, you will receive your results only by: MyChart Message (if you have MyChart) OR A paper copy in the mail If you have any lab test that is abnormal or we need to change your treatment, we will call you to review the results.   Testing/Procedures: Dr Irish Lack recommends you have a Calcium Score CT   Follow-Up: At [facility], you and your health needs are our priority.  As part of our continuing mission to provide you with exceptional heart care, we have created designated Provider Care Teams.  These Care Teams include your primary Cardiologist (physician) and Advanced Practice Providers (APPs -  Physician Assistants and Nurse Practitioners) who all work together to provide you with the care you need, when you need it.  We recommend signing up for the patient portal called "MyChart".  Sign up information is provided on this After Visit Summary.  MyChart is used to connect with patients for Virtual Visits (Telemedicine).  Patients are able to view lab/test results, encounter notes, upcoming appointments, etc.  Non-urgent messages can be sent to your provider as well.   To learn more about what you can do with MyChart, go to [email or web address].    Your next appointment:   12 month(s)  The format for your next appointment:   In Person  Provider:   You may see Larae Grooms, MD or one of the following Advanced Practice Providers on your designated Care Team:   Melina Copa, PA-C Ermalinda Barrios, PA-C   Other Instructions

## 2020-11-30 ENCOUNTER — Other Ambulatory Visit: Payer: Self-pay

## 2020-11-30 ENCOUNTER — Other Ambulatory Visit: Payer: PPO | Admitting: *Deleted

## 2020-11-30 DIAGNOSIS — R9431 Abnormal electrocardiogram [ECG] [EKG]: Secondary | ICD-10-CM

## 2020-11-30 LAB — CBC
Hematocrit: 37.8 % (ref 34.0–46.6)
Hemoglobin: 12.6 g/dL (ref 11.1–15.9)
MCH: 29.4 pg (ref 26.6–33.0)
MCHC: 33.3 g/dL (ref 31.5–35.7)
MCV: 88 fL (ref 79–97)
Platelets: 380 10*3/uL (ref 150–450)
RBC: 4.28 x10E6/uL (ref 3.77–5.28)
RDW: 12.3 % (ref 11.7–15.4)
WBC: 7.1 10*3/uL (ref 3.4–10.8)

## 2020-11-30 LAB — COMPREHENSIVE METABOLIC PANEL
ALT: 32 IU/L (ref 0–32)
AST: 27 IU/L (ref 0–40)
Albumin/Globulin Ratio: 2.7 — ABNORMAL HIGH (ref 1.2–2.2)
Albumin: 4.6 g/dL (ref 3.8–4.8)
Alkaline Phosphatase: 80 IU/L (ref 44–121)
BUN/Creatinine Ratio: 10 — ABNORMAL LOW (ref 12–28)
BUN: 11 mg/dL (ref 8–27)
Bilirubin Total: 0.6 mg/dL (ref 0.0–1.2)
CO2: 22 mmol/L (ref 20–29)
Calcium: 9.4 mg/dL (ref 8.7–10.3)
Chloride: 91 mmol/L — ABNORMAL LOW (ref 96–106)
Creatinine, Ser: 1.05 mg/dL — ABNORMAL HIGH (ref 0.57–1.00)
Globulin, Total: 1.7 g/dL (ref 1.5–4.5)
Glucose: 93 mg/dL (ref 65–99)
Potassium: 4.2 mmol/L (ref 3.5–5.2)
Sodium: 131 mmol/L — ABNORMAL LOW (ref 134–144)
Total Protein: 6.3 g/dL (ref 6.0–8.5)
eGFR: 58 mL/min/{1.73_m2} — ABNORMAL LOW (ref >59)

## 2020-11-30 LAB — LIPID PANEL
Chol/HDL Ratio: 1.9 ratio (ref 0.0–4.4)
Cholesterol, Total: 196 mg/dL (ref 100–199)
HDL: 101 mg/dL (ref >39)
LDL Chol Calc (NIH): 79 mg/dL (ref 0–99)
Triglycerides: 93 mg/dL (ref 0–149)
VLDL Cholesterol Cal: 16 mg/dL (ref 5–40)

## 2020-12-01 ENCOUNTER — Ambulatory Visit (INDEPENDENT_AMBULATORY_CARE_PROVIDER_SITE_OTHER)
Admission: RE | Admit: 2020-12-01 | Discharge: 2020-12-01 | Disposition: A | Discharge status: Home / Self Care | Payer: Self-pay | Source: Ambulatory Visit | Attending: Interventional Cardiology | Admitting: Interventional Cardiology

## 2020-12-01 DIAGNOSIS — R9431 Abnormal electrocardiogram [ECG] [EKG]: Secondary | ICD-10-CM

## 2020-12-06 ENCOUNTER — Telehealth: Payer: Self-pay | Admitting: Interventional Cardiology

## 2020-12-06 NOTE — Telephone Encounter (Signed)
I spoke with patient and reviewed Calcium Score CT results and recent lab results with her.

## 2020-12-06 NOTE — Telephone Encounter (Signed)
   Pt is returning call for lab results

## 2020-12-17 ENCOUNTER — Other Ambulatory Visit: Payer: Self-pay | Admitting: Interventional Cardiology

## 2021-01-04 DIAGNOSIS — Z681 Body mass index (BMI) 19 or less, adult: Secondary | ICD-10-CM | POA: Diagnosis not present

## 2021-01-04 DIAGNOSIS — Z01419 Encounter for gynecological examination (general) (routine) without abnormal findings: Secondary | ICD-10-CM | POA: Diagnosis not present

## 2021-01-04 DIAGNOSIS — Z01411 Encounter for gynecological examination (general) (routine) with abnormal findings: Secondary | ICD-10-CM | POA: Diagnosis not present

## 2021-01-04 DIAGNOSIS — Z1231 Encounter for screening mammogram for malignant neoplasm of breast: Secondary | ICD-10-CM | POA: Diagnosis not present

## 2021-01-04 DIAGNOSIS — N952 Postmenopausal atrophic vaginitis: Secondary | ICD-10-CM | POA: Diagnosis not present

## 2021-01-04 DIAGNOSIS — Z13820 Encounter for screening for osteoporosis: Secondary | ICD-10-CM | POA: Diagnosis not present

## 2021-01-04 DIAGNOSIS — Z1211 Encounter for screening for malignant neoplasm of colon: Secondary | ICD-10-CM | POA: Diagnosis not present

## 2021-01-06 ENCOUNTER — Other Ambulatory Visit: Payer: Self-pay | Admitting: Obstetrics and Gynecology

## 2021-01-06 DIAGNOSIS — Z13820 Encounter for screening for osteoporosis: Secondary | ICD-10-CM

## 2021-02-28 DIAGNOSIS — Z1211 Encounter for screening for malignant neoplasm of colon: Secondary | ICD-10-CM | POA: Diagnosis not present

## 2021-03-09 DIAGNOSIS — I10 Essential (primary) hypertension: Secondary | ICD-10-CM | POA: Diagnosis not present

## 2021-03-09 DIAGNOSIS — Z1159 Encounter for screening for other viral diseases: Secondary | ICD-10-CM | POA: Diagnosis not present

## 2021-03-09 DIAGNOSIS — E785 Hyperlipidemia, unspecified: Secondary | ICD-10-CM | POA: Diagnosis not present

## 2021-03-09 DIAGNOSIS — N183 Chronic kidney disease, stage 3 unspecified: Secondary | ICD-10-CM | POA: Diagnosis not present

## 2021-03-09 DIAGNOSIS — Z0000 Encounter for general adult medical examination without abnormal findings: Secondary | ICD-10-CM | POA: Diagnosis not present

## 2021-03-09 DIAGNOSIS — Z79899 Other long term (current) drug therapy: Secondary | ICD-10-CM | POA: Diagnosis not present

## 2021-03-09 DIAGNOSIS — E441 Mild protein-calorie malnutrition: Secondary | ICD-10-CM | POA: Diagnosis not present

## 2021-03-09 DIAGNOSIS — F331 Major depressive disorder, recurrent, moderate: Secondary | ICD-10-CM | POA: Diagnosis not present

## 2021-03-09 LAB — COLOGUARD: COLOGUARD: NEGATIVE

## 2021-03-09 LAB — EXTERNAL GENERIC LAB PROCEDURE: COLOGUARD: NEGATIVE

## 2021-05-29 DIAGNOSIS — Z0000 Encounter for general adult medical examination without abnormal findings: Secondary | ICD-10-CM | POA: Diagnosis not present

## 2021-06-28 DIAGNOSIS — L821 Other seborrheic keratosis: Secondary | ICD-10-CM | POA: Diagnosis not present

## 2021-06-28 DIAGNOSIS — D485 Neoplasm of uncertain behavior of skin: Secondary | ICD-10-CM | POA: Diagnosis not present

## 2021-06-28 DIAGNOSIS — L570 Actinic keratosis: Secondary | ICD-10-CM | POA: Diagnosis not present

## 2021-06-28 DIAGNOSIS — Z85820 Personal history of malignant melanoma of skin: Secondary | ICD-10-CM | POA: Diagnosis not present

## 2021-06-28 DIAGNOSIS — Z85828 Personal history of other malignant neoplasm of skin: Secondary | ICD-10-CM | POA: Diagnosis not present

## 2021-06-28 DIAGNOSIS — L820 Inflamed seborrheic keratosis: Secondary | ICD-10-CM | POA: Diagnosis not present

## 2021-06-28 DIAGNOSIS — L814 Other melanin hyperpigmentation: Secondary | ICD-10-CM | POA: Diagnosis not present

## 2021-07-03 ENCOUNTER — Other Ambulatory Visit: Payer: PPO

## 2021-08-31 DIAGNOSIS — F331 Major depressive disorder, recurrent, moderate: Secondary | ICD-10-CM | POA: Diagnosis not present

## 2021-08-31 DIAGNOSIS — E871 Hypo-osmolality and hyponatremia: Secondary | ICD-10-CM | POA: Diagnosis not present

## 2021-08-31 DIAGNOSIS — I10 Essential (primary) hypertension: Secondary | ICD-10-CM | POA: Diagnosis not present

## 2021-09-13 ENCOUNTER — Ambulatory Visit
Admission: RE | Admit: 2021-09-13 | Discharge: 2021-09-13 | Disposition: A | Discharge status: Home / Self Care | Payer: PPO | Source: Ambulatory Visit | Attending: Obstetrics and Gynecology | Admitting: Obstetrics and Gynecology

## 2021-09-13 DIAGNOSIS — M8589 Other specified disorders of bone density and structure, multiple sites: Secondary | ICD-10-CM | POA: Diagnosis not present

## 2021-09-13 DIAGNOSIS — Z13820 Encounter for screening for osteoporosis: Secondary | ICD-10-CM

## 2021-09-13 DIAGNOSIS — Z780 Asymptomatic menopausal state: Secondary | ICD-10-CM | POA: Diagnosis not present

## 2021-11-14 DIAGNOSIS — D1721 Benign lipomatous neoplasm of skin and subcutaneous tissue of right arm: Secondary | ICD-10-CM | POA: Diagnosis not present

## 2021-11-14 DIAGNOSIS — L578 Other skin changes due to chronic exposure to nonionizing radiation: Secondary | ICD-10-CM | POA: Diagnosis not present

## 2021-11-14 DIAGNOSIS — Z85828 Personal history of other malignant neoplasm of skin: Secondary | ICD-10-CM | POA: Diagnosis not present

## 2021-11-14 DIAGNOSIS — L308 Other specified dermatitis: Secondary | ICD-10-CM | POA: Diagnosis not present

## 2021-11-14 DIAGNOSIS — L570 Actinic keratosis: Secondary | ICD-10-CM | POA: Diagnosis not present

## 2021-11-14 DIAGNOSIS — I872 Venous insufficiency (chronic) (peripheral): Secondary | ICD-10-CM | POA: Diagnosis not present

## 2021-12-15 ENCOUNTER — Other Ambulatory Visit: Payer: Self-pay | Admitting: Interventional Cardiology

## 2021-12-20 DIAGNOSIS — D485 Neoplasm of uncertain behavior of skin: Secondary | ICD-10-CM | POA: Diagnosis not present

## 2021-12-20 DIAGNOSIS — C44722 Squamous cell carcinoma of skin of right lower limb, including hip: Secondary | ICD-10-CM | POA: Diagnosis not present

## 2021-12-20 DIAGNOSIS — L570 Actinic keratosis: Secondary | ICD-10-CM | POA: Diagnosis not present

## 2021-12-20 DIAGNOSIS — Z85828 Personal history of other malignant neoplasm of skin: Secondary | ICD-10-CM | POA: Diagnosis not present

## 2021-12-20 DIAGNOSIS — L308 Other specified dermatitis: Secondary | ICD-10-CM | POA: Diagnosis not present

## 2022-01-19 ENCOUNTER — Ambulatory Visit: Payer: PPO | Admitting: Interventional Cardiology

## 2022-01-19 NOTE — Progress Notes (Deleted)
Cardiology Office Note   Date:  01/19/2022   ID:  Anna, Norman 1955/10/29, MRN [id number]  PCP:  Jonathon Jordan, MD    No chief complaint on file.  Abnormal ECG  Wt Readings from Last 3 Encounters:  11/11/20 105 lb 6.4 oz (47.8 kg)  09/18/19 112 lb 6.4 oz (51 kg)       History of Present Illness: Anna Norman is a 66 y.o. female   who I saw several years ago for the evaluation of abnormal ECG.    She thinks she had an abnormal CRP in the past, 15-20 years ago.  No further tests were done.     As of 2021: "No family h/o CAD. 3 brothers are healthy form a cardiac standpoint.  One brother with multiple myeloma.  Other brothers with high cholesterol.  No stents or CABG."  She had an ECG at PMD when her BP was elevated (170s).  She has been on a diuretic for BP for over 20 years.   She had been having high BP readings that were high when she donated plasma in late 2020.      2021 echo showed: "Left ventricular ejection fraction, by estimation, is 60 to 65%. The  left ventricle has normal function. The left ventricle has no regional  wall motion abnormalities. Left ventricular diastolic parameters were  normal.   2. Right ventricular systolic function is normal. The right ventricular  size is normal. There is normal pulmonary artery systolic pressure. The  estimated right ventricular systolic pressure is 10.1 mmHg.   3. The mitral valve is grossly normal. No evidence of mitral valve  regurgitation. No evidence of mitral stenosis.   4. The aortic valve is tricuspid. Aortic valve regurgitation is not  visualized. No aortic stenosis is present.   5. The inferior vena cava is normal in size with greater than 50%  respiratory variability, suggesting right atrial pressure of 3 mmHg. "   Wroked at [employer].     Mother turned 80 in 4/22.  Father with prostate cancer.  No early CAD.     Past Medical History:  Diagnosis Date   Chronic fatigue     Chronic kidney disease (CKD), active medical management without dialysis, stage 3 (moderate) (HCC)    Depression    Elevated LFTs    Excessive daytime sleepiness    Hyperlipidemia    Hypertension    Insomnia    Moderate recurrent major depression ([place])     Past Surgical History:  Procedure Laterality Date   endometrosis     HYSTEROTOMY     MELANOMA EXCISION  2009x2, 2008   SKIN BIOPSY  2003   vaginal biospy       Current Outpatient Medications  Medication Sig Dispense Refill   buPROPion (WELLBUTRIN XL) 300 MG 24 hr tablet Take 300 mg by mouth daily.     CHELATED IRON PO Take 29 mg by mouth daily.     Estradiol 10 MCG TABS vaginal tablet Place vaginally.     fluorouracil (EFUDEX) 5 % cream Apply topically 2 (two) times daily.     imiquimod (ALDARA) 5 % cream imiquimod 5 % topical cream packet     lisinopril (ZESTRIL) 10 MG tablet TAKE 1 TABLET BY MOUTH EVERY DAY 90 tablet 0   Multiple Vitamin (MULTIVITAMIN) tablet Take 1 tablet by mouth daily.     mupirocin ointment (BACTROBAN) 2 % mupirocin 2 % topical ointment  traZODone (DESYREL) 50 MG tablet Take 50 mg by mouth at bedtime.     triamterene-hydrochlorothiazide (MAXZIDE) 75-50 MG tablet Take 1 tablet by mouth daily.     No current facility-administered medications for this visit.    Allergies:   Patient has no allergy information on record.    Social History:  The patient  reports that she has never smoked. She has never used smokeless tobacco. She reports current alcohol use. She reports that she does not use drugs.   Family History:  The patient's ***family history includes Cancer in her brother and brother; Cancer - Prostate in her father; Colon polyps in her brother; Glaucoma in her mother; Healthy in her brother; Hypertension in her mother; Other in her brother.    ROS:  Please see the history of present illness.   Otherwise, review of systems are positive for ***.   All other systems are reviewed and negative.     PHYSICAL EXAM: VS:  There were no vitals taken for this visit. , BMI There is no height or weight on file to calculate BMI. GEN: Well nourished, well developed, in no acute distress HEENT: normal Neck: no JVD, carotid bruits, or masses Cardiac: ***RRR; no murmurs, rubs, or gallops,no edema  Respiratory:  clear to auscultation bilaterally, normal work of breathing GI: soft, nontender, nondistended, + BS MS: no deformity or atrophy Skin: warm and dry, no rash Neuro:  Strength and sensation are intact Psych: euthymic mood, full affect   EKG:   The ekg ordered today demonstrates ***   Recent Labs: No results found for requested labs within last 365 days.   Lipid Panel    Component Value Date/Time   CHOL 196 11/30/2020 1010   TRIG 93 11/30/2020 1010   HDL 101 11/30/2020 1010   CHOLHDL 1.9 11/30/2020 1010   LDLCALC 79 11/30/2020 1010     Other studies Reviewed: Additional studies/ records that were reviewed today with results demonstrating: ***.   ASSESSMENT AND PLAN:  Abnormal ECG: Had a normal echo in 2021. Hypertension: Chronic renal insufficiency: Hyperlipidemia:   Current medicines are reviewed at length with the patient today.  The patient concerns regarding her medicines were addressed.  The following changes have been made:  No change***  Labs/ tests ordered today include: *** No orders of the defined types were placed in this encounter.   Recommend 150 minutes/week of aerobic exercise Low fat, low carb, high fiber diet recommended  Disposition:   FU in ***   Signed, Larae Grooms, MD  01/19/2022 10:04 AM    [facility] [place], [place], [place]  [address] Phone: [phone]; Fax: [phone]

## 2022-01-23 NOTE — Progress Notes (Unsigned)
Cardiology Office Note:    Date:  01/24/2022   ID:  Anna Norman, DOB 1955-09-16, MRN [id number]  PCP:  Jonathon Jordan, [place] Prov[facility] Cardiologist:  Larae Grooms, MD    Referring MD: Jonathon Jordan, MD   Chief Complaint:  Follow-up for hypertension, abnormal EKG    Patient Profile: Abnl EKG CAC Score 8/22: 0 Hypertension  Chronic kidney disease  Hyperlipidemia   Prior CV Studies: ECHO COMPLETE WO IMAGING ENHANCING AGENT 10/07/2019 EF 60-65, no RWMA, normal RVSF, RVSP 25.1   CT CARDIAC SCORING (SELF PAY ONLY) 12/01/2020 IMPRESSION: Coronary calcium score of 0. This was 0 percentile for age-, race-, and sex-matched controls.      History of Present Illness:   Anna Norman is a 66 y.o. female with the above problem list.  She was last seen by Dr. Irish Lack in 7/22. She returns for f/u.  She is here alone.  She has been doing well without chest pain, shortness of breath, syncope.  She has noted some low blood pressures at the [facility] after exercising.  She does not check her blood pressure at home.  The staff at the [facility] has noted the other members have mentioned that the blood pressure was different than they are normal.  She has not had any dizziness or near syncope.  She did have an episode of vertigo earlier in the year.  This resolved after a few days.        Past Medical History:  Diagnosis Date   Chronic fatigue    Chronic kidney disease (CKD), active medical management without dialysis, stage 3 (moderate) (HCC)    Depression    Elevated LFTs    Excessive daytime sleepiness    Hyperlipidemia    Hypertension    Insomnia    Moderate recurrent major depression (HCC)    Current Medications: Current Meds  Medication Sig   buPROPion (WELLBUTRIN XL) 300 MG 24 hr tablet Take 300 mg by mouth daily.   CHELATED IRON PO Take 29 mg by mouth daily.   fluorouracil (EFUDEX) 5 % cream 1 application  as needed (for seasonal (summer sun)).    imiquimod (ALDARA) 5 % cream Apply 1 Application topically as needed (seasonal).   lisinopril (ZESTRIL) 10 MG tablet TAKE 1 TABLET BY MOUTH EVERY DAY   Multiple Vitamin (MULTIVITAMIN) tablet Take 1 tablet by mouth daily.   traZODone (DESYREL) 50 MG tablet Take 50 mg by mouth as needed for sleep.   triamterene-hydrochlorothiazide (MAXZIDE) 75-50 MG tablet Take 1 tablet by mouth daily.    Allergies:   Patient has no allergy information on record.   Social History   Tobacco Use   Smoking status: Never   Smokeless tobacco: Never  Vaping Use   Vaping Use: Never used  Substance Use Topics   Alcohol use: Yes    Comment: occasionally   Drug use: Never    Family Hx: The patient's family history includes Cancer in her brother and brother; Cancer - Prostate in her father; Colon polyps in her brother; Glaucoma in her mother; Healthy in her brother; Hypertension in her mother; Other in her brother.  Review of Systems  Neurological:  Positive for vertigo.     EKGs/Labs/Other Test Reviewed:    EKG:  EKG is  ordered today.  The ekg ordered today demonstrates NSR, HR 73, normal axis, nonspecific ST-T wave changes, QTc 451, no change from prior tracing  Recent Labs: No results found for requested labs within  last 365 days.   Recent Lipid Panel No results for input(s): "CHOL", "TRIG", "HDL", "VLDL", "LDLCALC", "LDLDIRECT" in the last 8760 hours.   Risk Assessment/Calculations/Metrics:              Physical Exam:    VS:  BP 130/72   Pulse 73   Ht '5\' 2"'$  (1.575 m)   Wt 101 lb 9.6 oz (46.1 kg)   SpO2 99%   BMI 18.58 kg/m     Wt Readings from Last 3 Encounters:  01/24/22 101 lb 9.6 oz (46.1 kg)  11/11/20 105 lb 6.4 oz (47.8 kg)  09/18/19 112 lb 6.4 oz (51 kg)    Constitutional:      Appearance: Healthy appearance. Not in distress.  Neck:     Vascular: JVD normal.  Pulmonary:     Effort: Pulmonary effort is normal.     Breath sounds: No wheezing. No rales.  Cardiovascular:      Normal rate. Regular rhythm. Normal S1. Normal S2.      Murmurs: There is no murmur.  Edema:    Peripheral edema absent.  Abdominal:     Palpations: Abdomen is soft.  Skin:    General: Skin is warm and dry.  Neurological:     Mental Status: Alert and oriented to person, place and time.         ASSESSMENT & PLAN:   Hypertension Blood pressure is well controlled.  She did have some blood pressure readings in the 64P (systolic) at the [facility].  She has not had any symptoms of hypotension.  I have asked her to monitor her blood pressure at home.  Continue current dose of lisinopril and triamterene/HCTZ.  If she notices that her blood pressure is running less than 329 systolic or she is symptomatic, we can reduce one of her medications.  Follow-up in 1 year.           Dispo:  Return in about 1 year (around 01/25/2023) for Routine Follow Up with Dr. Irish Lack.   Medication Adjustments/Labs and Tests Ordered: Current medicines are reviewed at length with the patient today.  Concerns regarding medicines are outlined above.  Tests Ordered: Orders Placed This Encounter  Procedures   EKG 12-Lead   Medication Changes: No orders of the defined types were placed in this encounter.  Signed, Richardson Dopp, PA-C  01/24/2022 5:59 PM    [address], [place], [place]  [address] Phone: [phone]; Fax: [phone]

## 2022-01-24 ENCOUNTER — Encounter: Payer: Self-pay | Admitting: Physician Assistant

## 2022-01-24 ENCOUNTER — Ambulatory Visit: Payer: PPO | Attending: Physician Assistant | Admitting: Physician Assistant

## 2022-01-24 VITALS — BP 130/72 | HR 73 | Ht 62.0 in | Wt 101.6 lb

## 2022-01-24 DIAGNOSIS — I10 Essential (primary) hypertension: Secondary | ICD-10-CM

## 2022-01-24 NOTE — Assessment & Plan Note (Signed)
Blood pressure is well controlled.  She did have some blood pressure readings in the 67R (systolic) at the [facility].  She has not had any symptoms of hypotension.  I have asked her to monitor her blood pressure at home.  Continue current dose of lisinopril and triamterene/HCTZ.  If she notices that her blood pressure is running less than 373 systolic or she is symptomatic, we can reduce one of her medications.  Follow-up in 1 year.

## 2022-01-24 NOTE — Patient Instructions (Signed)
Medication Instructions:  Your physician recommends that you continue on your current medications as directed. Please refer to the Current Medication list given to you today.  *If you need a refill on your cardiac medications before your next appointment, please call your pharmacy*   Lab Work: None ordered  If you have labs (blood work) drawn today and your tests are completely normal, you will receive your results only by: [place] (if you have MyChart) OR A paper copy in the mail If you have any lab test that is abnormal or we need to change your treatment, we will call you to review the results.   Testing/Procedures: None ordered   Follow-Up: At [facility], you and your health needs are our priority.  As part of our continuing mission to provide you with exceptional heart care, we have created designated Provider Care Teams.  These Care Teams include your primary Cardiologist (physician) and Advanced Practice Providers (APPs -  Physician Assistants and Nurse Practitioners) who all work together to provide you with the care you need, when you need it.  We recommend signing up for the patient portal called "MyChart".  Sign up information is provided on this After Visit Summary.  MyChart is used to connect with patients for Virtual Visits (Telemedicine).  Patients are able to view lab/test results, encounter notes, upcoming appointments, etc.  Non-urgent messages can be sent to your provider as well.   To learn more about what you can do with MyChart, go to [email or web address].    Your next appointment:   1 year(s)  The format for your next appointment:   In Person  Provider:   Larae Grooms, MD     Other Instructions Get a blood pressure cuff.  Monitor your pressures at home.  If you are getting #'s on the top lower than 100 call and let us know.   Important Information About Sugar

## 2022-03-08 DIAGNOSIS — E785 Hyperlipidemia, unspecified: Secondary | ICD-10-CM | POA: Diagnosis not present

## 2022-03-08 DIAGNOSIS — Z0000 Encounter for general adult medical examination without abnormal findings: Secondary | ICD-10-CM | POA: Diagnosis not present

## 2022-03-08 DIAGNOSIS — F331 Major depressive disorder, recurrent, moderate: Secondary | ICD-10-CM | POA: Diagnosis not present

## 2022-03-08 DIAGNOSIS — I10 Essential (primary) hypertension: Secondary | ICD-10-CM | POA: Diagnosis not present

## 2022-03-08 DIAGNOSIS — N183 Chronic kidney disease, stage 3 unspecified: Secondary | ICD-10-CM | POA: Diagnosis not present

## 2022-03-08 DIAGNOSIS — E559 Vitamin D deficiency, unspecified: Secondary | ICD-10-CM | POA: Diagnosis not present

## 2022-08-13 DIAGNOSIS — Z85820 Personal history of malignant melanoma of skin: Secondary | ICD-10-CM | POA: Diagnosis not present

## 2022-08-13 DIAGNOSIS — D1801 Hemangioma of skin and subcutaneous tissue: Secondary | ICD-10-CM | POA: Diagnosis not present

## 2022-08-13 DIAGNOSIS — L853 Xerosis cutis: Secondary | ICD-10-CM | POA: Diagnosis not present

## 2022-08-13 DIAGNOSIS — Z85828 Personal history of other malignant neoplasm of skin: Secondary | ICD-10-CM | POA: Diagnosis not present

## 2022-08-13 DIAGNOSIS — D2261 Melanocytic nevi of right upper limb, including shoulder: Secondary | ICD-10-CM | POA: Diagnosis not present

## 2022-08-13 DIAGNOSIS — D1721 Benign lipomatous neoplasm of skin and subcutaneous tissue of right arm: Secondary | ICD-10-CM | POA: Diagnosis not present

## 2022-08-13 DIAGNOSIS — L821 Other seborrheic keratosis: Secondary | ICD-10-CM | POA: Diagnosis not present

## 2022-08-13 DIAGNOSIS — L570 Actinic keratosis: Secondary | ICD-10-CM | POA: Diagnosis not present

## 2022-08-13 DIAGNOSIS — L814 Other melanin hyperpigmentation: Secondary | ICD-10-CM | POA: Diagnosis not present

## 2022-08-13 DIAGNOSIS — I872 Venous insufficiency (chronic) (peripheral): Secondary | ICD-10-CM | POA: Diagnosis not present

## 2022-12-29 IMAGING — CT CT CARDIAC CORONARY ARTERY CALCIUM SCORE
3 series · 14 of 20 positions shown, 16 images · non-contrast
Comparison: None.
COMPARISON: None.

Addendum:
EXAM:
OVER-READ INTERPRETATION  CT CHEST

The following report is an over-read performed by radiologist Dr.
Adlai Shy [REDACTED] on 12/01/2020. This
over-read does not include interpretation of cardiac or coronary
anatomy or pathology. The coronary calcium score interpretation by
the cardiologist is attached.
CLINICAL DATA: Cardiovascular Disease Risk stratification
Coronary Calcium Score
TECHNIQUE: A gated, non-contrast computed tomography scan of the heart was
performed using 3mm slice thickness. Axial images were analyzed on a
dedicated workstation. Calcium scoring of the coronary arteries was
performed using the Agatston method.

[Series 2: casc 2.0 sa36 2 (id) (id) · axial · 0.40mm/px · z∈[-81,+15]mm · 6 of 68 slices shown, 8 images]
[im 10/68  vessel]
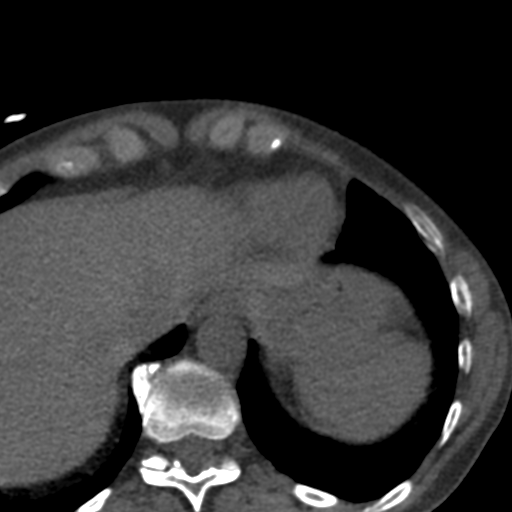
[im 10/68  lung]
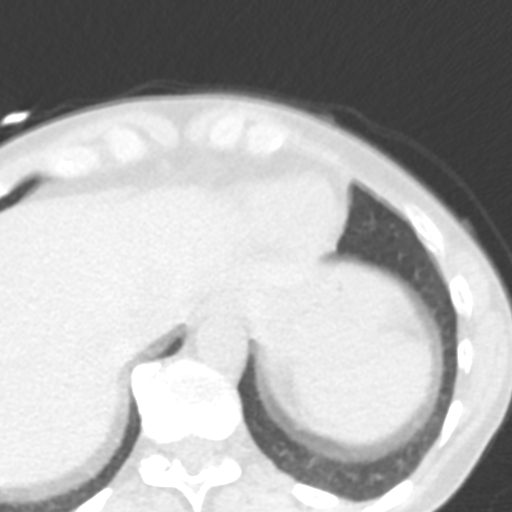
[im 20/68  vessel]
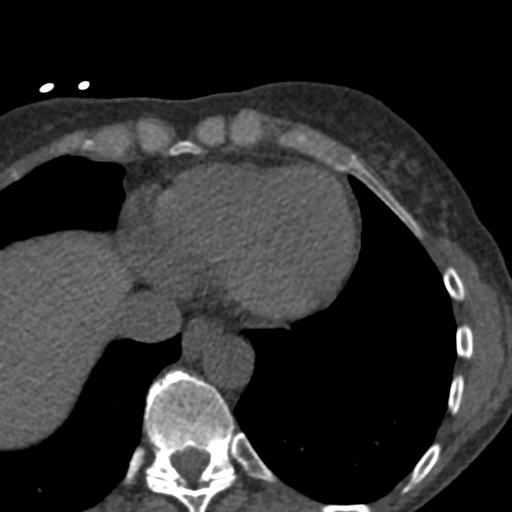
[im 29/68  vessel]
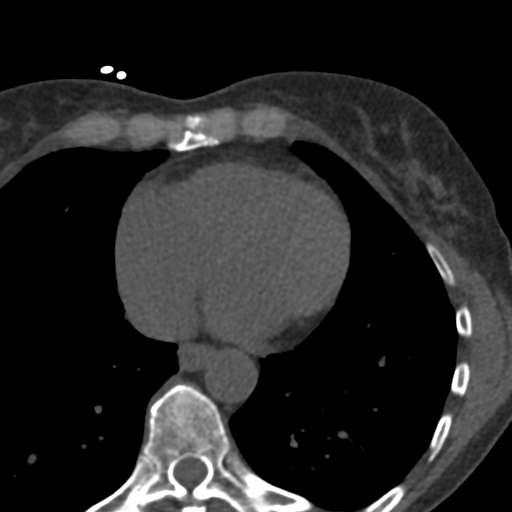
[im 39/68  vessel]
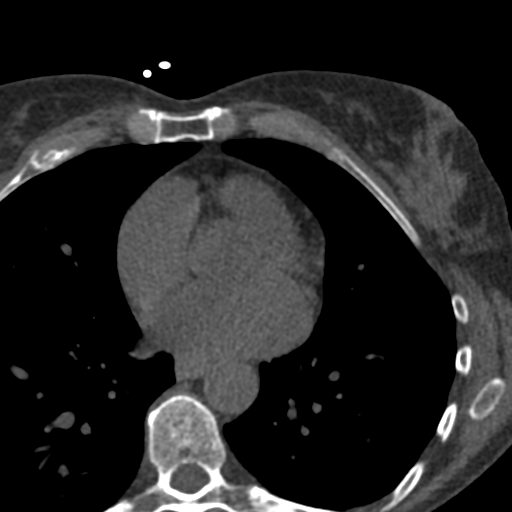
[im 48/68  vessel]
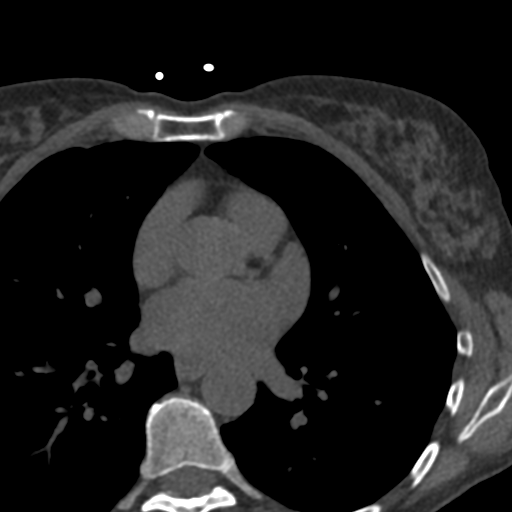
[im 48/68  lung]
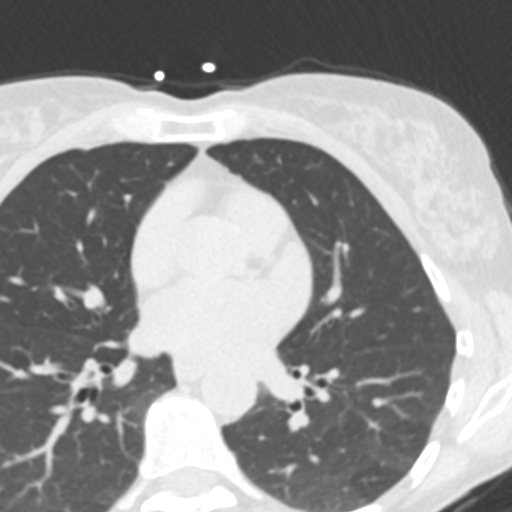
[im 58/68  vessel]
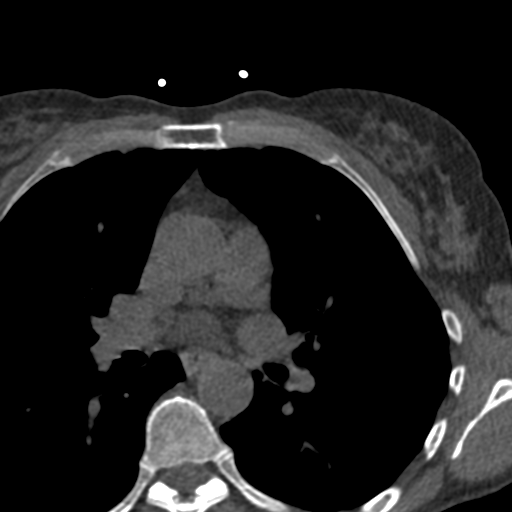

[Series 3: lung 72 % · axial · 0.66mm/px · z∈[-73,+8]mm · 4 of 45 slices shown]
[im 9/45  lung]
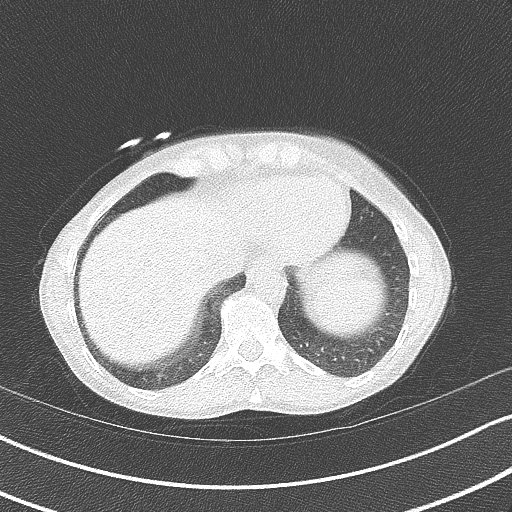
[im 18/45  lung]
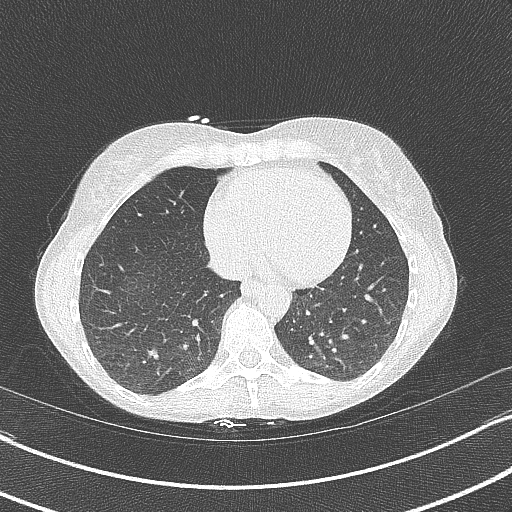
[im 27/45  lung]
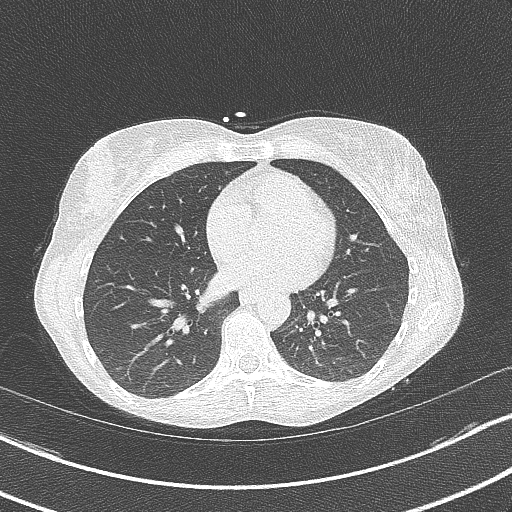
[im 36/45  lung]
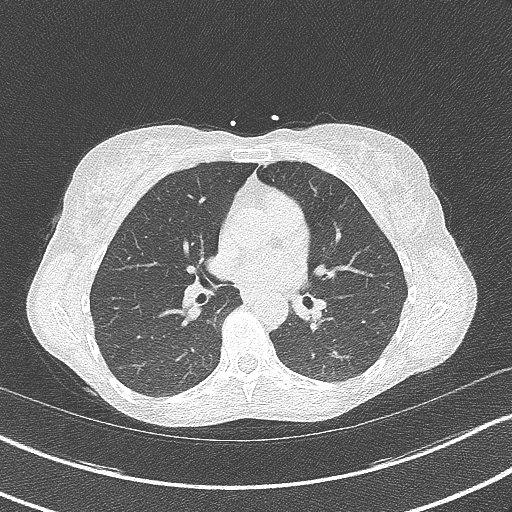

[Series 4: lung st 72 % · axial · 0.66mm/px · z∈[-73,+8]mm · 4 of 45 slices shown]
[im 9/45  lung]
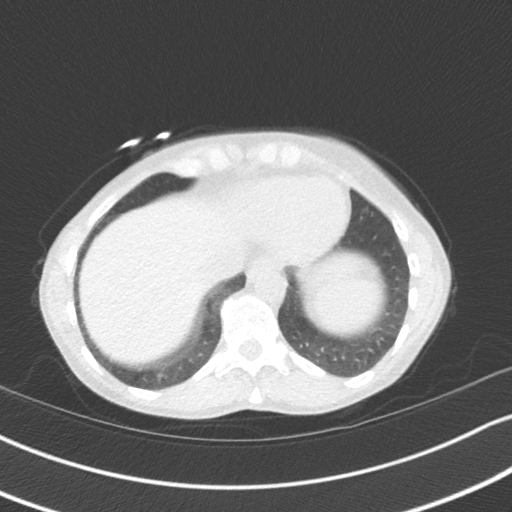
[im 18/45  lung]
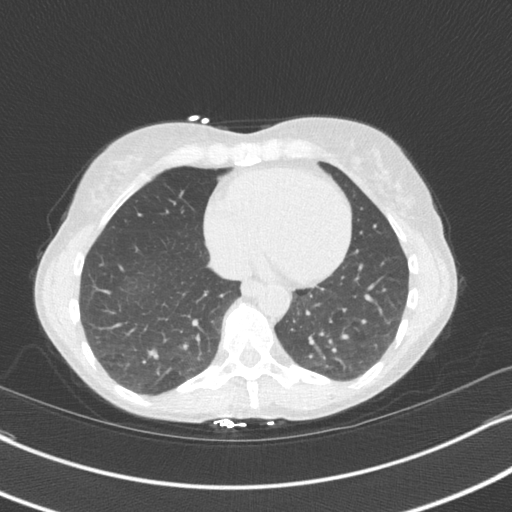
[im 27/45  lung]
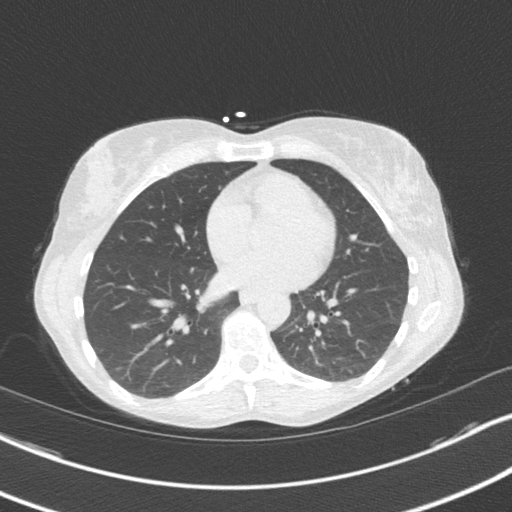
[im 36/45  lung]
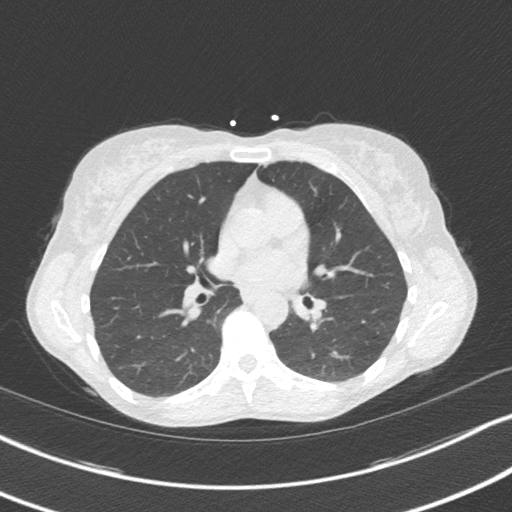

[14 of 20 positions shown; findings below may reference images not displayed]

FINDINGS: Within the visualized portions of the thorax there are no suspicious
appearing pulmonary nodules or masses, there is no acute
consolidative airspace disease, no pleural effusions, no
pneumothorax and no lymphadenopathy. Visualized portions of the
upper abdomen are unremarkable. There are no aggressive appearing
lytic or blastic lesions noted in the visualized portions of the
skeleton.
IMPRESSION: 1. No significant incidental noncardiac findings are noted.
FINDINGS: Coronary arteries: Normal origins.

Coronary Calcium Score:

Left main: 0

Left anterior descending artery: 0

Left circumflex artery: 0

Right coronary artery: 0

Total: 0

Percentile: 0

Pericardium: Normal.

Ascending Aorta: Normal caliber.

Non-cardiac: See separate report from [REDACTED].
IMPRESSION: Coronary calcium score of 0. This was 0 percentile for age-, race-,
and sex-matched controls.



If CAC=0, it is reasonable to withhold statin therapy and reassess
in 5 to 10 years, as long as higher risk conditions are absent
(diabetes mellitus, family history of premature CHD in first degree
relatives (males <55 years; females <65 years), cigarette smoking,
or LDL >=190 mg/dL).

If CAC is 1 to 99, it is reasonable to initiate statin therapy for
patients >=55 years of age.

If CAC is >=100 or >=75th percentile, it is reasonable to initiate
statin therapy at any age.

Cardiology referral should be considered for patients with CAC
scores >=400 or >=75th percentile.

*9092 AHA/ACC/AACVPR/AAPA/ABC/RENOME NEKRETNINE/REMIZA/CHAI/Frans Erry/TIBBY/ROMANE/ANONIMI
Guideline on the Management of Blood Cholesterol: A Report of the
American College of Cardiology/American Heart Association Task Force
on Clinical Practice Guidelines. J Am Coll Cardiol.
5206;73(24):0647-0210.

*** End of Addendum ***
EXAM:
OVER-READ INTERPRETATION  CT CHEST

The following report is an over-read performed by radiologist Dr.
Adlai Shy [REDACTED] on 12/01/2020. This
over-read does not include interpretation of cardiac or coronary
anatomy or pathology. The coronary calcium score interpretation by
the cardiologist is attached.
FINDINGS: Within the visualized portions of the thorax there are no suspicious
appearing pulmonary nodules or masses, there is no acute
consolidative airspace disease, no pleural effusions, no
pneumothorax and no lymphadenopathy. Visualized portions of the
upper abdomen are unremarkable. There are no aggressive appearing
lytic or blastic lesions noted in the visualized portions of the
skeleton.
IMPRESSION: 1. No significant incidental noncardiac findings are noted.

## 2023-02-13 ENCOUNTER — Telehealth: Payer: Self-pay | Admitting: Interventional Cardiology

## 2023-02-13 NOTE — Telephone Encounter (Signed)
*  STAT* If patient is at the pharmacy, call can be transferred to refill team.   1. Which medications need to be refilled? (please list name of each medication and dose if known) lisinopril (ZESTRIL) 10 MG tablet   2. Which pharmacy/location (including street and city if local pharmacy) is medication to be sent to? [facility] [phone] [facility] - [place], [place] - [address]   3. Do they need a 30 day or 90 day supply? 90

## 2023-02-14 MED ORDER — LISINOPRIL 10 MG PO TABS: 10.0000 mg | ORAL_TABLET | Freq: Every day | ORAL | 0 refills | Status: AC

## 2023-03-12 ENCOUNTER — Ambulatory Visit: Payer: PPO | Admitting: Cardiology

## 2023-03-15 DIAGNOSIS — Z0000 Encounter for general adult medical examination without abnormal findings: Secondary | ICD-10-CM | POA: Diagnosis not present

## 2023-03-15 DIAGNOSIS — Z1331 Encounter for screening for depression: Secondary | ICD-10-CM | POA: Diagnosis not present

## 2023-03-15 DIAGNOSIS — E785 Hyperlipidemia, unspecified: Secondary | ICD-10-CM | POA: Diagnosis not present

## 2023-03-15 DIAGNOSIS — I10 Essential (primary) hypertension: Secondary | ICD-10-CM | POA: Diagnosis not present

## 2023-03-15 DIAGNOSIS — F331 Major depressive disorder, recurrent, moderate: Secondary | ICD-10-CM | POA: Diagnosis not present

## 2023-03-15 DIAGNOSIS — N1831 Chronic kidney disease, stage 3a: Secondary | ICD-10-CM | POA: Diagnosis not present

## 2023-03-15 DIAGNOSIS — E441 Mild protein-calorie malnutrition: Secondary | ICD-10-CM | POA: Diagnosis not present

## 2023-03-15 DIAGNOSIS — Z23 Encounter for immunization: Secondary | ICD-10-CM | POA: Diagnosis not present

## 2023-09-18 DIAGNOSIS — L821 Other seborrheic keratosis: Secondary | ICD-10-CM | POA: Diagnosis not present

## 2023-09-18 DIAGNOSIS — L814 Other melanin hyperpigmentation: Secondary | ICD-10-CM | POA: Diagnosis not present

## 2023-09-18 DIAGNOSIS — Z85828 Personal history of other malignant neoplasm of skin: Secondary | ICD-10-CM | POA: Diagnosis not present

## 2023-09-18 DIAGNOSIS — D1801 Hemangioma of skin and subcutaneous tissue: Secondary | ICD-10-CM | POA: Diagnosis not present

## 2023-09-18 DIAGNOSIS — L2089 Other atopic dermatitis: Secondary | ICD-10-CM | POA: Diagnosis not present

## 2023-09-18 DIAGNOSIS — L570 Actinic keratosis: Secondary | ICD-10-CM | POA: Diagnosis not present

## 2023-09-18 DIAGNOSIS — Z85820 Personal history of malignant melanoma of skin: Secondary | ICD-10-CM | POA: Diagnosis not present

## 2023-12-16 ENCOUNTER — Ambulatory Visit: Attending: Cardiology | Admitting: Cardiology

## 2023-12-16 ENCOUNTER — Encounter: Payer: Self-pay | Admitting: Cardiology

## 2023-12-16 VITALS — BP 142/80 | HR 81 | Resp 16 | Ht 62.0 in | Wt 100.3 lb

## 2023-12-16 DIAGNOSIS — I10 Essential (primary) hypertension: Secondary | ICD-10-CM | POA: Diagnosis not present

## 2023-12-16 DIAGNOSIS — E782 Mixed hyperlipidemia: Secondary | ICD-10-CM | POA: Diagnosis not present

## 2023-12-16 DIAGNOSIS — R9431 Abnormal electrocardiogram [ECG] [EKG]: Secondary | ICD-10-CM | POA: Diagnosis not present

## 2023-12-16 NOTE — Patient Instructions (Signed)
 Medication Instructions:  No medication changes were made at this visit. Continue current regimen.   *If you need a refill on your cardiac medications before your next appointment, please call your pharmacy*  Lab Work: None ordered today. If you have labs (blood work) drawn today and your tests are completely normal, you will receive your results only by: MyChart Message (if you have MyChart) OR A paper copy in the mail If you have any lab test that is abnormal or we need to change your treatment, we will call you to review the results.  Testing/Procedures: Your physician has requested that you have an echocardiogram. Echocardiography is a painless test that uses sound waves to create images of your heart. It provides your doctor with information about the size and shape of your heart and how well your heart's chambers and valves are working. This procedure takes approximately one hour. There are no restrictions for this procedure. Please do NOT wear cologne, perfume, aftershave, or lotions (deodorant is allowed). Please arrive 15 minutes prior to your appointment time.  Please note: We ask at that you not bring children with you during ultrasound (echo/ vascular) testing. Due to room size and safety concerns, children are not allowed in the ultrasound rooms during exams. Our front office staff cannot provide observation of children in our lobby area while testing is being conducted. An adult accompanying a patient to their appointment will only be allowed in the ultrasound room at the discretion of the ultrasound technician under special circumstances. We apologize for any inconvenience.   Follow-Up: At [facility], you and your health needs are our priority.  As part of our continuing mission to provide you with exceptional heart care, our providers are all part of one team.  This team includes your primary Cardiologist (physician) and Advanced Practice Providers or APPs (Physician  Assistants and Nurse Practitioners) who all work together to provide you with the care you need, when you need it.  Your next appointment:   1 year(s)  Provider:   Olinda Bertrand, DO

## 2023-12-16 NOTE — Progress Notes (Signed)
 Cardiology Office Note:  .   ID:  Anna Norman, Anna Norman [birth year], MRN [id number] PCP:  Verena Mems, MD  Former Cardiology Providers: Dr. Candyce Reek [facility] Providers Cardiologist:  Madonna Large, DO , [facility] (established care 12/16/23) Electrophysiologist:  None  Click to update primary MD,subspecialty MD or APP then REFRESH:1}    Chief Complaint  Patient presents with   Follow-up    1 year follow up      History of Present Illness: Anna   Tanji Norman is a 68 y.o. Caucasian female whose past medical history and cardiovascular risk factors includes: Hypertension, hyperlipidemia.   Formally under the care of Dr. Candyce Reek who last saw Ronal Wanda Geronimo back in July 2022. I am seeing her for the first time to re-establishing care.   She was referred to the practice in May 2021 for abnormal EKG. Echo and coronary calcium score was performed in the past. She preferred to hold of on lipid lowering agents and work on lifestyle and diet changes. Her LDL was 137mg  /dL and most recent is 896 mg/dL. Over the last year she denies chest pain, shortness of breath, or reduced functional status. She continues to work at [employer] / week and walks couple miles while at work.   No structured exercise program or daily routine. No longer going to [facility].   She has a history of low blood pressure that has normalized, though her blood pressure was slightly elevated today.  Review of Systems: .   Review of Systems  Cardiovascular:  Negative for chest pain, claudication, irregular heartbeat, leg swelling, near-syncope, orthopnea, palpitations, paroxysmal nocturnal dyspnea and syncope.  Respiratory:  Negative for shortness of breath.   Hematologic/Lymphatic: Negative for bleeding problem.    Studies Reviewed:   EKG: EKG Interpretation Date/Time:  Monday December 16 2023 15:21:13 EDT Ventricular Rate:  83 PR Interval:  142 QRS Duration:  80 QT  Interval:  380 QTC Calculation: 446 R Axis:   83  Text Interpretation: Normal sinus rhythm ST & T wave abnormality, consider inferolateral ischemia When compared to 10/22/2020 no significant change. Reconfirmed by Large Madonna [phone]) on 12/16/2023 4:17:54 PM  Echocardiogram: 10/07/2019 EF 60-65, no RWMA, normal RVSF, RVSP 25.1  CT CARDIAC SCORING (SELF PAY ONLY) 12/01/2020 Coronary calcium score of 0. This was 0 percentile for age-, race-, and sex-matched controls.  RADIOLOGY: None  Risk Assessment/Calculations:   NA   Labs:    External Labs: Collected: 03/15/2023 [facility] database Total cholesterol 205, triglycerides 110, HDL 83, LDL 103. Potassium 4.   Physical Exam:    Today's Vitals   12/16/23 1513 12/16/23 1604  BP: (!) 145/73 (!) 142/80  Pulse: 81   Resp: 16   SpO2: 99%   Weight: 100 lb 4.8 oz (45.5 kg)   Height: 5' 2 (1.575 m)    Body mass index is 18.35 kg/m. Wt Readings from Last 3 Encounters:  12/16/23 100 lb 4.8 oz (45.5 kg)  01/24/22 101 lb 9.6 oz (46.1 kg)  11/11/20 105 lb 6.4 oz (47.8 kg)    Physical Exam  Constitutional: No distress.  hemodynamically stable  Neck: No JVD present.  Cardiovascular: Normal rate, regular rhythm, S1 normal and S2 normal. Exam reveals no gallop, no S3 and no S4.  No murmur heard. Pulmonary/Chest: Effort normal and breath sounds normal. No stridor. She has no wheezes. She has no rales.  Musculoskeletal:        General: No edema.  Cervical back: Neck supple.  Skin: Skin is warm.     Impression & Recommendation(s):  Impression:   ICD-10-CM   1. Primary hypertension  I10 EKG 12-Lead    2. Abnormal EKG  R94.31 ECHOCARDIOGRAM COMPLETE    3. Mixed hyperlipidemia  E78.2        Recommendation(s):  Primary hypertension Office BP are not well controlled.  Recommended to check BP at home.  Currently on Maxzide 75/50 mg po qday.  Currently on Lisinopril  10 mg po qday. She will follow up w/ PCP.   Abnormal  EKG EKG today illustrates sinus rhythm with ST-T changes in the inferolateral leads Similar EKG findings noted in prior tracings as well. Clinically asymptomatic. Did undergo coronary calcification further screening as well as echocardiogram in the past results reviewed as part of transition of care Echo will be ordered to evaluate for structural heart disease and left ventricular systolic function.  Prior to next office visit Patient is asked to seek medical attention if she develops chest pain so that appropriate workup can be carried out.  As long as she is asymptomatic no additional workup warranted at this time  Mixed hyperlipidemia Most recent LDL 103 mg/dL. Cardiology following peripherally, managed by primary care provider.   Orders Placed:  Orders Placed This Encounter  Procedures   EKG 12-Lead   ECHOCARDIOGRAM COMPLETE    Standing Status:   Future    Expected Date:   12/23/2023    Expiration Date:   12/15/2024    Where [facility] this test [facility] performed[facility]    Does the patient weigh less than or greater than 250 lbs?:   Patient weighs less than 250 lbs    Perflutren DEFINITY (image enhancing agent) should be administered unless hypersensitivity or allergy exist:   Administer Perflutren    Reason for exam-Echo:   Abnormal ECG  R94.31     Final Medication List:   No orders of the defined types were placed in this encounter.   Medications Discontinued During This Encounter  Medication Reason   fluorouracil (EFUDEX) 5 % cream Patient Preference   imiquimod (ALDARA) 5 % cream Patient Preference   traZODone (DESYREL) 50 MG tablet Patient Preference   CHELATED IRON PO Patient Preference     Current Outpatient Medications:    buPROPion (WELLBUTRIN XL) 300 MG 24 hr tablet, Take 300 mg by mouth daily., Disp: , Rfl:    lisinopril  (ZESTRIL ) 10 MG tablet, Take 1 tablet (10 mg total) by mouth daily., Disp: 30 tablet, Rfl: 0   Multiple Vitamin (MULTIVITAMIN) tablet,  Take 1 tablet by mouth daily., Disp: , Rfl:    triamterene-hydrochlorothiazide (MAXZIDE) 75-50 MG tablet, Take 1 tablet by mouth daily., Disp: , Rfl:   Consent:   NA  Disposition:   1 year follow-up sooner if needed  Her questions and concerns were addressed to her satisfaction. She voices understanding of the recommendations provided during this encounter.    Signed, Madonna Michele HAS, [facility]  A Division of [facility] [address] [place], [place] [address]

## 2023-12-29 ENCOUNTER — Encounter: Payer: Self-pay | Admitting: Cardiology

## 2024-01-21 ENCOUNTER — Ambulatory Visit (HOSPITAL_COMMUNITY)
Admission: RE | Admit: 2024-01-21 | Discharge: 2024-01-21 | Disposition: A | Discharge status: Home / Self Care | Source: Ambulatory Visit | Attending: Internal Medicine | Admitting: Internal Medicine

## 2024-01-21 DIAGNOSIS — R9431 Abnormal electrocardiogram [ECG] [EKG]: Secondary | ICD-10-CM | POA: Insufficient documentation

## 2024-01-21 LAB — ECHOCARDIOGRAM COMPLETE
Area-P 1/2: 3.17 cm2
S' Lateral: 2.7 cm

## 2024-01-30 ENCOUNTER — Ambulatory Visit: Payer: Self-pay | Admitting: Cardiology

## 2024-01-31 NOTE — Progress Notes (Signed)
 Attempted to call patient in regards to echo results. No answer left a vm to call back.    Patient never viewed in mychart. Printed, highlighted doctor  comments and mailed to patient

## 2024-02-04 NOTE — Telephone Encounter (Signed)
 Pt returning call

## 2024-03-25 DIAGNOSIS — N1831 Chronic kidney disease, stage 3a: Secondary | ICD-10-CM | POA: Diagnosis not present

## 2024-03-25 DIAGNOSIS — E785 Hyperlipidemia, unspecified: Secondary | ICD-10-CM | POA: Diagnosis not present

## 2024-03-25 DIAGNOSIS — E871 Hypo-osmolality and hyponatremia: Secondary | ICD-10-CM | POA: Diagnosis not present

## 2024-03-25 DIAGNOSIS — I10 Essential (primary) hypertension: Secondary | ICD-10-CM | POA: Diagnosis not present

## 2024-03-25 DIAGNOSIS — E441 Mild protein-calorie malnutrition: Secondary | ICD-10-CM | POA: Diagnosis not present

## 2024-03-25 DIAGNOSIS — F331 Major depressive disorder, recurrent, moderate: Secondary | ICD-10-CM | POA: Diagnosis not present

## 2024-03-25 DIAGNOSIS — E559 Vitamin D deficiency, unspecified: Secondary | ICD-10-CM | POA: Diagnosis not present

## 2024-03-25 DIAGNOSIS — Z1211 Encounter for screening for malignant neoplasm of colon: Secondary | ICD-10-CM | POA: Diagnosis not present

## 2024-03-25 DIAGNOSIS — E2839 Other primary ovarian failure: Secondary | ICD-10-CM | POA: Diagnosis not present

## 2024-03-25 DIAGNOSIS — Z0000 Encounter for general adult medical examination without abnormal findings: Secondary | ICD-10-CM | POA: Diagnosis not present

## 2024-03-30 ENCOUNTER — Other Ambulatory Visit (HOSPITAL_BASED_OUTPATIENT_CLINIC_OR_DEPARTMENT_OTHER): Payer: Self-pay | Admitting: Family Medicine

## 2024-03-30 DIAGNOSIS — E2839 Other primary ovarian failure: Secondary | ICD-10-CM
